# Patient Record
Sex: Female | Born: 1935 | Race: White | Hispanic: No | Smoking: Never smoker
Health system: Southern US, Community
[De-identification: ages and names within clinical notes are randomized; demographics above are authoritative.]

## PROBLEM LIST (undated history)

## (undated) DIAGNOSIS — J302 Other seasonal allergic rhinitis: Secondary | ICD-10-CM

## (undated) DIAGNOSIS — F0390 Unspecified dementia without behavioral disturbance: Secondary | ICD-10-CM

## (undated) DIAGNOSIS — B029 Zoster without complications: Secondary | ICD-10-CM

## (undated) DIAGNOSIS — E538 Deficiency of other specified B group vitamins: Secondary | ICD-10-CM

## (undated) DIAGNOSIS — M1990 Unspecified osteoarthritis, unspecified site: Secondary | ICD-10-CM

## (undated) DIAGNOSIS — I739 Peripheral vascular disease, unspecified: Secondary | ICD-10-CM

## (undated) DIAGNOSIS — G250 Essential tremor: Secondary | ICD-10-CM

## (undated) DIAGNOSIS — J449 Chronic obstructive pulmonary disease, unspecified: Secondary | ICD-10-CM

## (undated) DIAGNOSIS — I10 Essential (primary) hypertension: Secondary | ICD-10-CM

## (undated) DIAGNOSIS — Q613 Polycystic kidney, unspecified: Secondary | ICD-10-CM

## (undated) DIAGNOSIS — I639 Cerebral infarction, unspecified: Secondary | ICD-10-CM

## (undated) DIAGNOSIS — E559 Vitamin D deficiency, unspecified: Secondary | ICD-10-CM

## (undated) DIAGNOSIS — K219 Gastro-esophageal reflux disease without esophagitis: Secondary | ICD-10-CM

## (undated) DIAGNOSIS — E785 Hyperlipidemia, unspecified: Secondary | ICD-10-CM

## (undated) HISTORY — PX: COLON SURGERY: SHX602

## (undated) HISTORY — PX: HYSTEROTOMY: SHX1776

## (undated) HISTORY — PX: ORIF ANKLE FRACTURE: SUR919

## (undated) HISTORY — PX: HERNIA REPAIR: SHX51

## (undated) HISTORY — PX: CATARACT EXTRACTION: SUR2

---

## 2003-04-17 ENCOUNTER — Other Ambulatory Visit: Payer: Self-pay

## 2004-01-19 ENCOUNTER — Ambulatory Visit: Payer: Self-pay | Admitting: Pain Medicine

## 2004-02-01 ENCOUNTER — Ambulatory Visit: Payer: Self-pay | Admitting: Pain Medicine

## 2004-04-05 ENCOUNTER — Ambulatory Visit: Payer: Self-pay | Admitting: Pain Medicine

## 2004-09-13 ENCOUNTER — Ambulatory Visit: Payer: Self-pay | Admitting: Pain Medicine

## 2004-12-19 ENCOUNTER — Ambulatory Visit: Payer: Self-pay | Admitting: *Deleted

## 2005-01-29 ENCOUNTER — Ambulatory Visit: Payer: Self-pay | Admitting: *Deleted

## 2005-03-16 ENCOUNTER — Ambulatory Visit: Payer: Self-pay | Admitting: Internal Medicine

## 2005-04-13 ENCOUNTER — Ambulatory Visit: Payer: Self-pay | Admitting: Urology

## 2005-10-08 ENCOUNTER — Ambulatory Visit: Payer: Self-pay | Admitting: Internal Medicine

## 2006-10-08 ENCOUNTER — Ambulatory Visit: Payer: Self-pay | Admitting: Pain Medicine

## 2006-10-09 ENCOUNTER — Ambulatory Visit: Payer: Self-pay | Admitting: Pain Medicine

## 2006-11-21 ENCOUNTER — Ambulatory Visit: Payer: Self-pay | Admitting: Pain Medicine

## 2006-12-18 ENCOUNTER — Ambulatory Visit: Payer: Self-pay | Admitting: Pain Medicine

## 2007-01-23 ENCOUNTER — Ambulatory Visit: Payer: Self-pay | Admitting: Pain Medicine

## 2007-02-03 ENCOUNTER — Ambulatory Visit: Payer: Self-pay | Admitting: Pain Medicine

## 2007-03-22 ENCOUNTER — Inpatient Hospital Stay: Payer: Self-pay | Admitting: Internal Medicine

## 2007-05-12 ENCOUNTER — Ambulatory Visit: Payer: Self-pay | Admitting: Family Medicine

## 2007-06-29 ENCOUNTER — Emergency Department: Payer: Self-pay | Admitting: Internal Medicine

## 2007-10-01 ENCOUNTER — Emergency Department: Payer: Self-pay | Admitting: Emergency Medicine

## 2007-10-01 ENCOUNTER — Ambulatory Visit: Payer: Self-pay | Admitting: Family Medicine

## 2008-01-04 ENCOUNTER — Ambulatory Visit: Payer: Self-pay | Admitting: Family Medicine

## 2008-01-16 ENCOUNTER — Ambulatory Visit: Payer: Self-pay | Admitting: Family Medicine

## 2008-02-20 ENCOUNTER — Inpatient Hospital Stay: Payer: Self-pay | Admitting: Internal Medicine

## 2008-03-30 ENCOUNTER — Emergency Department: Payer: Self-pay | Admitting: Emergency Medicine

## 2008-04-04 ENCOUNTER — Inpatient Hospital Stay: Payer: Self-pay | Admitting: Internal Medicine

## 2008-07-17 ENCOUNTER — Emergency Department: Payer: Self-pay | Admitting: Emergency Medicine

## 2008-09-14 ENCOUNTER — Inpatient Hospital Stay: Payer: Self-pay | Admitting: Internal Medicine

## 2009-01-20 ENCOUNTER — Ambulatory Visit: Payer: Self-pay | Admitting: Orthopedic Surgery

## 2009-02-06 ENCOUNTER — Emergency Department: Payer: Self-pay | Admitting: Emergency Medicine

## 2009-03-23 ENCOUNTER — Ambulatory Visit: Payer: Self-pay | Admitting: Internal Medicine

## 2009-06-26 ENCOUNTER — Emergency Department: Payer: Self-pay | Admitting: Internal Medicine

## 2009-07-14 ENCOUNTER — Emergency Department: Payer: Self-pay | Admitting: Emergency Medicine

## 2009-07-22 ENCOUNTER — Inpatient Hospital Stay: Payer: Self-pay | Admitting: Internal Medicine

## 2009-10-11 ENCOUNTER — Ambulatory Visit: Payer: Self-pay | Admitting: Internal Medicine

## 2009-10-13 ENCOUNTER — Ambulatory Visit: Payer: Self-pay | Admitting: Internal Medicine

## 2011-03-21 ENCOUNTER — Inpatient Hospital Stay: Payer: Self-pay | Admitting: Internal Medicine

## 2011-05-14 ENCOUNTER — Emergency Department: Payer: Self-pay | Admitting: Emergency Medicine

## 2011-05-14 LAB — CBC
HCT: 39.1 % (ref 35.0–47.0)
MCH: 30.3 pg (ref 26.0–34.0)
MCV: 91 fL (ref 80–100)
Platelet: 193 10*3/uL (ref 150–440)
RBC: 4.32 10*6/uL (ref 3.80–5.20)
RDW: 14.2 % (ref 11.5–14.5)
WBC: 6.8 10*3/uL (ref 3.6–11.0)

## 2011-05-14 LAB — COMPREHENSIVE METABOLIC PANEL
Albumin: 4 g/dL (ref 3.4–5.0)
Anion Gap: 8 (ref 7–16)
BUN: 22 mg/dL — ABNORMAL HIGH (ref 7–18)
Bilirubin,Total: 0.6 mg/dL (ref 0.2–1.0)
Chloride: 106 mmol/L (ref 98–107)
Co2: 29 mmol/L (ref 21–32)
Creatinine: 1.06 mg/dL (ref 0.60–1.30)
EGFR (African American): >60
EGFR (Non-African Amer.): 52 — ABNORMAL LOW
Glucose: 96 mg/dL (ref 65–99)
Potassium: 4.5 mmol/L (ref 3.5–5.1)
SGOT(AST): 24 U/L (ref 15–37)
SGPT (ALT): 26 U/L
Total Protein: 7.9 g/dL (ref 6.4–8.2)

## 2011-05-14 LAB — URINALYSIS, COMPLETE
Bilirubin,UR: NEGATIVE
Glucose,UR: NEGATIVE mg/dL (ref 0–75)
Nitrite: POSITIVE
Protein: 30
RBC,UR: <1 /HPF (ref 0–5)
Specific Gravity: 1.017 (ref 1.003–1.030)
WBC UR: 10 /HPF (ref 0–5)

## 2011-05-14 LAB — TROPONIN I: Troponin-I: <0.02 ng/mL

## 2011-08-03 ENCOUNTER — Ambulatory Visit: Payer: Self-pay

## 2011-09-04 ENCOUNTER — Inpatient Hospital Stay: Payer: Self-pay | Admitting: Internal Medicine

## 2011-09-04 LAB — CBC WITH DIFFERENTIAL/PLATELET
Basophil #: 0 10*3/uL (ref 0.0–0.1)
Basophil %: 0.4 %
Eosinophil #: 0.3 10*3/uL (ref 0.0–0.7)
HCT: 41.5 % (ref 35.0–47.0)
Lymphocyte #: 1 10*3/uL (ref 1.0–3.6)
Lymphocyte %: 17.2 %
MCH: 29.5 pg (ref 26.0–34.0)
MCHC: 32.3 g/dL (ref 32.0–36.0)
MCV: 91 fL (ref 80–100)
Monocyte #: 0.7 x10 3/mm (ref 0.2–0.9)
Monocyte %: 11.8 %
Neutrophil #: 3.8 10*3/uL (ref 1.4–6.5)
Platelet: 176 10*3/uL (ref 150–440)
RBC: 4.54 10*6/uL (ref 3.80–5.20)
WBC: 5.8 10*3/uL (ref 3.6–11.0)

## 2011-09-04 LAB — URINALYSIS, COMPLETE
Bilirubin,UR: NEGATIVE
Blood: NEGATIVE
Glucose,UR: NEGATIVE mg/dL (ref 0–75)
Hyaline Cast: 30
Ketone: NEGATIVE
Ph: 5 (ref 4.5–8.0)
Protein: NEGATIVE
Specific Gravity: 1.021 (ref 1.003–1.030)
Squamous Epithelial: 5
WBC UR: 1 /HPF (ref 0–5)

## 2011-09-04 LAB — COMPREHENSIVE METABOLIC PANEL
BUN: 40 mg/dL — ABNORMAL HIGH (ref 7–18)
Bilirubin,Total: 0.5 mg/dL (ref 0.2–1.0)
Calcium, Total: 8.6 mg/dL (ref 8.5–10.1)
Co2: 24 mmol/L (ref 21–32)
EGFR (Non-African Amer.): 32 — ABNORMAL LOW
Glucose: 92 mg/dL (ref 65–99)
Osmolality: 293 (ref 275–301)
Potassium: 4.7 mmol/L (ref 3.5–5.1)
SGPT (ALT): 21 U/L
Sodium: 142 mmol/L (ref 136–145)
Total Protein: 6.6 g/dL (ref 6.4–8.2)

## 2011-09-04 LAB — MAGNESIUM: Magnesium: 2.4 mg/dL

## 2011-09-05 LAB — BASIC METABOLIC PANEL
Anion Gap: 8 (ref 7–16)
Calcium, Total: 8.4 mg/dL — ABNORMAL LOW (ref 8.5–10.1)
Chloride: 108 mmol/L — ABNORMAL HIGH (ref 98–107)
Co2: 26 mmol/L (ref 21–32)
Creatinine: 1.2 mg/dL (ref 0.60–1.30)
Glucose: 85 mg/dL (ref 65–99)
Osmolality: 289 (ref 275–301)
Sodium: 142 mmol/L (ref 136–145)

## 2011-09-05 LAB — CBC WITH DIFFERENTIAL/PLATELET
Basophil %: 0.4 %
Eosinophil %: 4.8 %
HGB: 13.4 g/dL (ref 12.0–16.0)
Lymphocyte %: 15.5 %
MCHC: 33.5 g/dL (ref 32.0–36.0)
MCV: 90 fL (ref 80–100)
Monocyte %: 11.3 %
Neutrophil #: 3.9 10*3/uL (ref 1.4–6.5)
Neutrophil %: 68 %
Platelet: 151 10*3/uL (ref 150–440)
RBC: 4.44 10*6/uL (ref 3.80–5.20)
WBC: 5.8 10*3/uL (ref 3.6–11.0)

## 2011-09-07 LAB — URINE CULTURE

## 2011-09-08 LAB — BASIC METABOLIC PANEL
BUN: 15 mg/dL (ref 7–18)
Co2: 25 mmol/L (ref 21–32)
Creatinine: 0.8 mg/dL (ref 0.60–1.30)
EGFR (African American): >60
Osmolality: 279 (ref 275–301)

## 2011-09-09 LAB — CBC WITH DIFFERENTIAL/PLATELET
Basophil #: 0 10*3/uL (ref 0.0–0.1)
Basophil %: 0.2 %
Eosinophil #: 0.1 10*3/uL (ref 0.0–0.7)
Eosinophil %: 0.9 %
HGB: 14.8 g/dL (ref 12.0–16.0)
Lymphocyte #: 1.1 10*3/uL (ref 1.0–3.6)
MCV: 88 fL (ref 80–100)
Monocyte %: 8.8 %
Neutrophil #: 5.7 10*3/uL (ref 1.4–6.5)
Neutrophil %: 75.3 %
Platelet: 231 10*3/uL (ref 150–440)
RBC: 5.01 10*6/uL (ref 3.80–5.20)
RDW: 13.8 % (ref 11.5–14.5)
WBC: 7.6 10*3/uL (ref 3.6–11.0)

## 2011-09-09 LAB — COMPREHENSIVE METABOLIC PANEL
Alkaline Phosphatase: 86 U/L (ref 50–136)
Bilirubin,Total: 0.5 mg/dL (ref 0.2–1.0)
Co2: 25 mmol/L (ref 21–32)
Creatinine: 0.89 mg/dL (ref 0.60–1.30)
EGFR (Non-African Amer.): 57 — ABNORMAL LOW
Osmolality: 281 (ref 275–301)
Potassium: 3.7 mmol/L (ref 3.5–5.1)
SGPT (ALT): 38 U/L
Sodium: 139 mmol/L (ref 136–145)
Total Protein: 7 g/dL (ref 6.4–8.2)

## 2011-09-10 LAB — CBC WITH DIFFERENTIAL/PLATELET
Basophil %: 0.2 %
Eosinophil %: 0.6 %
HCT: 40.9 % (ref 35.0–47.0)
HGB: 14 g/dL (ref 12.0–16.0)
Lymphocyte #: 1.1 10*3/uL (ref 1.0–3.6)
Lymphocyte %: 14 %
MCV: 88 fL (ref 80–100)
Monocyte #: 0.7 x10 3/mm (ref 0.2–0.9)
Monocyte %: 8.8 %
Neutrophil #: 6.3 10*3/uL (ref 1.4–6.5)
RBC: 4.65 10*6/uL (ref 3.80–5.20)
RDW: 13.7 % (ref 11.5–14.5)

## 2011-09-10 LAB — COMPREHENSIVE METABOLIC PANEL
Anion Gap: 8 (ref 7–16)
BUN: 17 mg/dL (ref 7–18)
Bilirubin,Total: 0.5 mg/dL (ref 0.2–1.0)
Calcium, Total: 8.5 mg/dL (ref 8.5–10.1)
Chloride: 107 mmol/L (ref 98–107)
Creatinine: 0.9 mg/dL (ref 0.60–1.30)
EGFR (African American): >60
EGFR (Non-African Amer.): 57 — ABNORMAL LOW
Osmolality: 282 (ref 275–301)
SGOT(AST): 38 U/L — ABNORMAL HIGH (ref 15–37)
Sodium: 141 mmol/L (ref 136–145)

## 2011-09-10 LAB — CULTURE, BLOOD (SINGLE)

## 2011-09-10 LAB — TSH: Thyroid Stimulating Horm: 1.8 u[IU]/mL

## 2011-09-12 LAB — BASIC METABOLIC PANEL WITH GFR
Anion Gap: 8
BUN: 20 mg/dL — ABNORMAL HIGH
Calcium, Total: 8.3 mg/dL — ABNORMAL LOW
Chloride: 107 mmol/L
Co2: 26 mmol/L
Creatinine: 1.06 mg/dL
EGFR (African American): 54 — ABNORMAL LOW
EGFR (Non-African Amer.): 47 — ABNORMAL LOW
Glucose: 92 mg/dL
Osmolality: 284
Potassium: 3.5 mmol/L
Sodium: 141 mmol/L

## 2011-09-13 LAB — BASIC METABOLIC PANEL
Anion Gap: 4 — ABNORMAL LOW (ref 7–16)
Calcium, Total: 8.3 mg/dL — ABNORMAL LOW (ref 8.5–10.1)
Co2: 29 mmol/L (ref 21–32)
Creatinine: 1.06 mg/dL (ref 0.60–1.30)
EGFR (African American): 54 — ABNORMAL LOW
EGFR (Non-African Amer.): 47 — ABNORMAL LOW
Glucose: 93 mg/dL (ref 65–99)

## 2011-09-14 LAB — CBC WITH DIFFERENTIAL/PLATELET
Basophil #: 0 10*3/uL (ref 0.0–0.1)
Basophil %: 0.5 %
HCT: 38.7 % (ref 35.0–47.0)
HGB: 13.1 g/dL (ref 12.0–16.0)
Lymphocyte #: 1.1 10*3/uL (ref 1.0–3.6)
Lymphocyte %: 20.9 %
MCH: 30 pg (ref 26.0–34.0)
Monocyte #: 0.5 x10 3/mm (ref 0.2–0.9)
Neutrophil %: 63.5 %
Platelet: 155 10*3/uL (ref 150–440)
RBC: 4.36 10*6/uL (ref 3.80–5.20)
RDW: 14.1 % (ref 11.5–14.5)

## 2011-09-14 LAB — COMPREHENSIVE METABOLIC PANEL
Alkaline Phosphatase: 73 U/L (ref 50–136)
BUN: 14 mg/dL (ref 7–18)
Bilirubin,Total: 0.4 mg/dL (ref 0.2–1.0)
Chloride: 105 mmol/L (ref 98–107)
Glucose: 96 mg/dL (ref 65–99)
Osmolality: 283 (ref 275–301)
Potassium: 3.7 mmol/L (ref 3.5–5.1)
SGPT (ALT): 34 U/L
Sodium: 142 mmol/L (ref 136–145)

## 2011-12-20 ENCOUNTER — Ambulatory Visit: Payer: Self-pay | Admitting: Internal Medicine

## 2012-04-29 ENCOUNTER — Emergency Department: Payer: Self-pay | Admitting: Emergency Medicine

## 2012-04-30 LAB — BASIC METABOLIC PANEL
Anion Gap: 8 (ref 7–16)
Calcium, Total: 8.8 mg/dL (ref 8.5–10.1)
Chloride: 105 mmol/L (ref 98–107)
Co2: 24 mmol/L (ref 21–32)
Creatinine: 1.14 mg/dL (ref 0.60–1.30)
EGFR (African American): 49 — ABNORMAL LOW
EGFR (Non-African Amer.): 43 — ABNORMAL LOW
Osmolality: 278 (ref 275–301)
Sodium: 137 mmol/L (ref 136–145)

## 2012-04-30 LAB — CBC
HGB: 14.4 g/dL (ref 12.0–16.0)
MCH: 29.3 pg (ref 26.0–34.0)
MCHC: 32.7 g/dL (ref 32.0–36.0)
Platelet: 248 10*3/uL (ref 150–440)
WBC: 13.7 10*3/uL — ABNORMAL HIGH (ref 3.6–11.0)

## 2012-04-30 LAB — URINALYSIS, COMPLETE
Bacteria: NONE SEEN
Glucose,UR: NEGATIVE mg/dL (ref 0–75)
Protein: 100
RBC,UR: 4 /HPF (ref 0–5)
Specific Gravity: 1.014 (ref 1.003–1.030)

## 2012-05-20 ENCOUNTER — Emergency Department: Payer: Self-pay | Admitting: Emergency Medicine

## 2012-06-23 ENCOUNTER — Emergency Department: Payer: Self-pay | Admitting: Emergency Medicine

## 2012-06-23 LAB — BASIC METABOLIC PANEL
Calcium, Total: 8.4 mg/dL — ABNORMAL LOW (ref 8.5–10.1)
Chloride: 105 mmol/L (ref 98–107)
Co2: 30 mmol/L (ref 21–32)
Creatinine: 1.03 mg/dL (ref 0.60–1.30)
EGFR (African American): 56 — ABNORMAL LOW
Glucose: 96 mg/dL (ref 65–99)
Osmolality: 283 (ref 275–301)
Sodium: 141 mmol/L (ref 136–145)

## 2012-06-23 LAB — CBC
HCT: 39.6 % (ref 35.0–47.0)
HGB: 13.4 g/dL (ref 12.0–16.0)
MCH: 30.9 pg (ref 26.0–34.0)
MCHC: 33.9 g/dL (ref 32.0–36.0)
MCV: 91 fL (ref 80–100)
Platelet: 182 10*3/uL (ref 150–440)
RBC: 4.35 10*6/uL (ref 3.80–5.20)
RDW: 14.5 % (ref 11.5–14.5)

## 2012-06-23 LAB — URINALYSIS, COMPLETE
Bilirubin,UR: NEGATIVE
Glucose,UR: NEGATIVE mg/dL (ref 0–75)
Ph: 6 (ref 4.5–8.0)
Protein: NEGATIVE
RBC,UR: 1 /HPF (ref 0–5)
Specific Gravity: 1.014 (ref 1.003–1.030)
WBC UR: 3 /HPF (ref 0–5)

## 2012-09-07 ENCOUNTER — Emergency Department: Payer: Self-pay | Admitting: Emergency Medicine

## 2012-11-05 ENCOUNTER — Emergency Department: Payer: Self-pay | Admitting: Emergency Medicine

## 2012-11-21 ENCOUNTER — Ambulatory Visit: Payer: Self-pay | Admitting: Unknown Physician Specialty

## 2012-12-11 ENCOUNTER — Ambulatory Visit: Payer: Self-pay | Admitting: Physical Medicine and Rehabilitation

## 2013-03-24 ENCOUNTER — Inpatient Hospital Stay: Payer: Self-pay

## 2013-03-24 LAB — CBC
MCHC: 33.9 g/dL (ref 32.0–36.0)
MCV: 91 fL (ref 80–100)
Platelet: 138 10*3/uL — ABNORMAL LOW (ref 150–440)
RBC: 4.16 10*6/uL (ref 3.80–5.20)

## 2013-03-24 LAB — COMPREHENSIVE METABOLIC PANEL
Anion Gap: 6 — ABNORMAL LOW (ref 7–16)
BUN: 17 mg/dL (ref 7–18)
Calcium, Total: 8.9 mg/dL (ref 8.5–10.1)
Co2: 24 mmol/L (ref 21–32)
Creatinine: 1.1 mg/dL (ref 0.60–1.30)
EGFR (African American): 51 — ABNORMAL LOW
EGFR (Non-African Amer.): 44 — ABNORMAL LOW
SGOT(AST): 34 U/L (ref 15–37)
Sodium: 136 mmol/L (ref 136–145)
Total Protein: 7 g/dL (ref 6.4–8.2)

## 2013-03-24 LAB — URINALYSIS, COMPLETE
Bilirubin,UR: NEGATIVE
Blood: NEGATIVE
Glucose,UR: NEGATIVE mg/dL (ref 0–75)
Ketone: NEGATIVE
Leukocyte Esterase: NEGATIVE
Ph: 7 (ref 4.5–8.0)
RBC,UR: 2 /HPF (ref 0–5)
WBC UR: 1 /HPF (ref 0–5)

## 2013-03-24 LAB — TROPONIN I: Troponin-I: <0.02 ng/mL

## 2013-03-24 LAB — RAPID INFLUENZA A&B ANTIGENS

## 2013-03-25 LAB — CBC WITH DIFFERENTIAL/PLATELET
Eosinophil %: 0.1 %
HCT: 36.5 % (ref 35.0–47.0)
Lymphocyte #: 0.4 10*3/uL — ABNORMAL LOW (ref 1.0–3.6)
Lymphocyte %: 8.8 %
MCH: 30.8 pg (ref 26.0–34.0)
MCV: 91 fL (ref 80–100)
Neutrophil %: 73.2 %
Platelet: 130 10*3/uL — ABNORMAL LOW (ref 150–440)
RBC: 4.03 10*6/uL (ref 3.80–5.20)
WBC: 4.1 10*3/uL (ref 3.6–11.0)

## 2013-03-25 LAB — BASIC METABOLIC PANEL
BUN: 17 mg/dL (ref 7–18)
Co2: 28 mmol/L (ref 21–32)
Creatinine: 0.94 mg/dL (ref 0.60–1.30)
EGFR (Non-African Amer.): 53 — ABNORMAL LOW
Glucose: 105 mg/dL — ABNORMAL HIGH (ref 65–99)
Potassium: 3.7 mmol/L (ref 3.5–5.1)
Sodium: 140 mmol/L (ref 136–145)

## 2013-03-26 ENCOUNTER — Ambulatory Visit: Payer: Self-pay | Admitting: Internal Medicine

## 2013-03-26 LAB — CBC WITH DIFFERENTIAL/PLATELET
Basophil #: 0 10*3/uL (ref 0.0–0.1)
Basophil %: 0.4 %
Eosinophil #: 0 10*3/uL (ref 0.0–0.7)
Eosinophil %: 0.5 %
HCT: 34 % — ABNORMAL LOW (ref 35.0–47.0)
HGB: 11.6 g/dL — ABNORMAL LOW (ref 12.0–16.0)
Lymphocyte #: 0.4 10*3/uL — ABNORMAL LOW (ref 1.0–3.6)
Lymphocyte %: 12.6 %
MCH: 30.6 pg (ref 26.0–34.0)
MCHC: 34 g/dL (ref 32.0–36.0)
MCV: 90 fL (ref 80–100)
Monocyte #: 0.7 x10 3/mm (ref 0.2–0.9)
Monocyte %: 19 %
Neutrophil #: 2.3 10*3/uL (ref 1.4–6.5)
Neutrophil %: 67.5 %
Platelet: 137 10*3/uL — ABNORMAL LOW (ref 150–440)
RBC: 3.78 10*6/uL — ABNORMAL LOW (ref 3.80–5.20)
RDW: 13.6 % (ref 11.5–14.5)
WBC: 3.5 10*3/uL — ABNORMAL LOW (ref 3.6–11.0)

## 2013-03-26 LAB — BASIC METABOLIC PANEL
Anion Gap: 4 — ABNORMAL LOW (ref 7–16)
BUN: 18 mg/dL (ref 7–18)
Calcium, Total: 8.2 mg/dL — ABNORMAL LOW (ref 8.5–10.1)
Chloride: 105 mmol/L (ref 98–107)
Co2: 31 mmol/L (ref 21–32)
Creatinine: 1.11 mg/dL (ref 0.60–1.30)
EGFR (African American): 51 — ABNORMAL LOW
EGFR (Non-African Amer.): 44 — ABNORMAL LOW
Glucose: 93 mg/dL (ref 65–99)
Osmolality: 281 (ref 275–301)
Potassium: 3.3 mmol/L — ABNORMAL LOW (ref 3.5–5.1)
Sodium: 140 mmol/L (ref 136–145)

## 2013-03-26 LAB — URINE CULTURE

## 2013-03-27 LAB — BASIC METABOLIC PANEL
Anion Gap: 5 — ABNORMAL LOW (ref 7–16)
BUN: 17 mg/dL (ref 7–18)
Calcium, Total: 8.7 mg/dL (ref 8.5–10.1)
Chloride: 103 mmol/L (ref 98–107)
Co2: 29 mmol/L (ref 21–32)
Creatinine: 1.18 mg/dL (ref 0.60–1.30)
EGFR (Non-African Amer.): 41 — ABNORMAL LOW
GFR CALC AF AMER: 47 — AB
Glucose: 90 mg/dL (ref 65–99)
Osmolality: 275 (ref 275–301)
Potassium: 3 mmol/L — ABNORMAL LOW (ref 3.5–5.1)
SODIUM: 137 mmol/L (ref 136–145)

## 2013-03-27 LAB — MAGNESIUM: MAGNESIUM: 1.9 mg/dL

## 2013-03-27 LAB — CBC WITH DIFFERENTIAL/PLATELET
BASOS ABS: 0 10*3/uL (ref 0.0–0.1)
Basophil %: 0.8 %
Eosinophil #: 0 10*3/uL (ref 0.0–0.7)
Eosinophil %: 1.2 %
HCT: 37.1 % (ref 35.0–47.0)
HGB: 12.7 g/dL (ref 12.0–16.0)
Lymphocyte #: 0.5 10*3/uL — ABNORMAL LOW (ref 1.0–3.6)
Lymphocyte %: 16.1 %
MCH: 30.7 pg (ref 26.0–34.0)
MCHC: 34.3 g/dL (ref 32.0–36.0)
MCV: 90 fL (ref 80–100)
Monocyte #: 0.6 x10 3/mm (ref 0.2–0.9)
Monocyte %: 18.7 %
NEUTROS ABS: 2 10*3/uL (ref 1.4–6.5)
NEUTROS PCT: 63.2 %
Platelet: 157 10*3/uL (ref 150–440)
RBC: 4.14 10*6/uL (ref 3.80–5.20)
RDW: 13.6 % (ref 11.5–14.5)
WBC: 3.1 10*3/uL — AB (ref 3.6–11.0)

## 2013-03-28 LAB — BASIC METABOLIC PANEL
Anion Gap: 8 (ref 7–16)
BUN: 14 mg/dL (ref 7–18)
CREATININE: 0.97 mg/dL (ref 0.60–1.30)
Calcium, Total: 8.4 mg/dL — ABNORMAL LOW (ref 8.5–10.1)
Chloride: 108 mmol/L — ABNORMAL HIGH (ref 98–107)
Co2: 22 mmol/L (ref 21–32)
EGFR (African American): 60 — ABNORMAL LOW
GFR CALC NON AF AMER: 51 — AB
GLUCOSE: 84 mg/dL (ref 65–99)
Osmolality: 275 (ref 275–301)
POTASSIUM: 3.5 mmol/L (ref 3.5–5.1)
Sodium: 138 mmol/L (ref 136–145)

## 2013-03-28 LAB — CULTURE, BLOOD (SINGLE)

## 2013-03-28 LAB — WBC: WBC: 3.1 10*3/uL — ABNORMAL LOW (ref 3.6–11.0)

## 2013-03-29 LAB — CBC WITH DIFFERENTIAL/PLATELET
Basophil #: 0 10*3/uL (ref 0.0–0.1)
Basophil %: 0.3 %
EOS ABS: 0.1 10*3/uL (ref 0.0–0.7)
EOS PCT: 4.5 %
HCT: 37.9 % (ref 35.0–47.0)
HGB: 13 g/dL (ref 12.0–16.0)
LYMPHS PCT: 17.8 %
Lymphocyte #: 0.6 10*3/uL — ABNORMAL LOW (ref 1.0–3.6)
MCH: 30.7 pg (ref 26.0–34.0)
MCHC: 34.2 g/dL (ref 32.0–36.0)
MCV: 90 fL (ref 80–100)
MONOS PCT: 16.2 %
Monocyte #: 0.5 x10 3/mm (ref 0.2–0.9)
Neutrophil #: 2 10*3/uL (ref 1.4–6.5)
Neutrophil %: 61.2 %
Platelet: 161 10*3/uL (ref 150–440)
RBC: 4.24 10*6/uL (ref 3.80–5.20)
RDW: 13.8 % (ref 11.5–14.5)
WBC: 3.3 10*3/uL — ABNORMAL LOW (ref 3.6–11.0)

## 2013-03-29 LAB — BASIC METABOLIC PANEL
ANION GAP: 7 (ref 7–16)
BUN: 14 mg/dL (ref 7–18)
CHLORIDE: 110 mmol/L — AB (ref 98–107)
CO2: 25 mmol/L (ref 21–32)
CREATININE: 1.18 mg/dL (ref 0.60–1.30)
Calcium, Total: 8.6 mg/dL (ref 8.5–10.1)
EGFR (African American): 47 — ABNORMAL LOW
GFR CALC NON AF AMER: 41 — AB
GLUCOSE: 105 mg/dL — AB (ref 65–99)
Osmolality: 284 (ref 275–301)
Potassium: 3.4 mmol/L — ABNORMAL LOW (ref 3.5–5.1)
Sodium: 142 mmol/L (ref 136–145)

## 2013-03-30 LAB — CBC WITH DIFFERENTIAL/PLATELET
Basophil #: 0 10*3/uL (ref 0.0–0.1)
Basophil %: 0.6 %
EOS PCT: 6.3 %
Eosinophil #: 0.3 10*3/uL (ref 0.0–0.7)
HCT: 37.4 % (ref 35.0–47.0)
HGB: 12.8 g/dL (ref 12.0–16.0)
Lymphocyte #: 0.7 10*3/uL — ABNORMAL LOW (ref 1.0–3.6)
Lymphocyte %: 15.1 %
MCH: 30.7 pg (ref 26.0–34.0)
MCHC: 34.3 g/dL (ref 32.0–36.0)
MCV: 90 fL (ref 80–100)
Monocyte #: 0.6 x10 3/mm (ref 0.2–0.9)
Monocyte %: 13 %
Neutrophil #: 3.1 10*3/uL (ref 1.4–6.5)
Neutrophil %: 65 %
Platelet: 168 10*3/uL (ref 150–440)
RBC: 4.17 10*6/uL (ref 3.80–5.20)
RDW: 13.9 % (ref 11.5–14.5)
WBC: 4.8 10*3/uL (ref 3.6–11.0)

## 2013-03-30 LAB — BASIC METABOLIC PANEL
Anion Gap: 4 — ABNORMAL LOW (ref 7–16)
BUN: 17 mg/dL (ref 7–18)
CHLORIDE: 112 mmol/L — AB (ref 98–107)
CREATININE: 1.06 mg/dL (ref 0.60–1.30)
Calcium, Total: 8.8 mg/dL (ref 8.5–10.1)
Co2: 27 mmol/L (ref 21–32)
EGFR (African American): 54 — ABNORMAL LOW
EGFR (Non-African Amer.): 46 — ABNORMAL LOW
GLUCOSE: 96 mg/dL (ref 65–99)
Osmolality: 286 (ref 275–301)
Potassium: 3.8 mmol/L (ref 3.5–5.1)
Sodium: 143 mmol/L (ref 136–145)

## 2013-03-31 LAB — HEPATIC FUNCTION PANEL A (ARMC)
ALBUMIN: 3.3 g/dL — AB (ref 3.4–5.0)
ALT: 28 U/L (ref 12–78)
Alkaline Phosphatase: 66 U/L
Bilirubin, Direct: 0.1 mg/dL (ref 0.00–0.20)
Bilirubin,Total: 0.4 mg/dL (ref 0.2–1.0)
SGOT(AST): 25 U/L (ref 15–37)
TOTAL PROTEIN: 7.1 g/dL (ref 6.4–8.2)

## 2013-03-31 LAB — POTASSIUM: Potassium: 3.7 mmol/L (ref 3.5–5.1)

## 2013-03-31 LAB — LIPASE, BLOOD: Lipase: 309 U/L (ref 73–393)

## 2013-03-31 LAB — AMYLASE: AMYLASE: 67 U/L (ref 25–115)

## 2013-04-01 LAB — BASIC METABOLIC PANEL
Anion Gap: 5 — ABNORMAL LOW (ref 7–16)
BUN: 27 mg/dL — AB (ref 7–18)
Calcium, Total: 9.3 mg/dL (ref 8.5–10.1)
Chloride: 112 mmol/L — ABNORMAL HIGH (ref 98–107)
Co2: 24 mmol/L (ref 21–32)
Creatinine: 0.91 mg/dL (ref 0.60–1.30)
EGFR (African American): >60
EGFR (Non-African Amer.): 56 — ABNORMAL LOW
GLUCOSE: 114 mg/dL — AB (ref 65–99)
OSMOLALITY: 287 (ref 275–301)
Potassium: 4.1 mmol/L (ref 3.5–5.1)
SODIUM: 141 mmol/L (ref 136–145)

## 2013-04-01 LAB — CLOSTRIDIUM DIFFICILE(ARMC)

## 2013-04-02 LAB — CBC WITH DIFFERENTIAL/PLATELET
BASOS ABS: 0 10*3/uL (ref 0.0–0.1)
BASOS PCT: 0.2 %
Eosinophil #: 0 10*3/uL (ref 0.0–0.7)
Eosinophil %: 0.6 %
HCT: 38.9 % (ref 35.0–47.0)
HGB: 12.9 g/dL (ref 12.0–16.0)
LYMPHS ABS: 0.9 10*3/uL — AB (ref 1.0–3.6)
Lymphocyte %: 11.6 %
MCH: 29.9 pg (ref 26.0–34.0)
MCHC: 33.2 g/dL (ref 32.0–36.0)
MCV: 90 fL (ref 80–100)
Monocyte #: 0.7 x10 3/mm (ref 0.2–0.9)
Monocyte %: 8.8 %
NEUTROS ABS: 5.8 10*3/uL (ref 1.4–6.5)
NEUTROS PCT: 78.8 %
Platelet: 232 10*3/uL (ref 150–440)
RBC: 4.32 10*6/uL (ref 3.80–5.20)
RDW: 14.1 % (ref 11.5–14.5)
WBC: 7.4 10*3/uL (ref 3.6–11.0)

## 2013-04-02 LAB — BASIC METABOLIC PANEL
Anion Gap: 6 — ABNORMAL LOW (ref 7–16)
BUN: 29 mg/dL — ABNORMAL HIGH (ref 7–18)
CHLORIDE: 113 mmol/L — AB (ref 98–107)
CREATININE: 1.07 mg/dL (ref 0.60–1.30)
Calcium, Total: 8.4 mg/dL — ABNORMAL LOW (ref 8.5–10.1)
Co2: 26 mmol/L (ref 21–32)
EGFR (African American): 53 — ABNORMAL LOW
EGFR (Non-African Amer.): 46 — ABNORMAL LOW
Glucose: 102 mg/dL — ABNORMAL HIGH (ref 65–99)
Osmolality: 295 (ref 275–301)
Potassium: 3.6 mmol/L (ref 3.5–5.1)
Sodium: 145 mmol/L (ref 136–145)

## 2013-04-03 LAB — BASIC METABOLIC PANEL
Anion Gap: 6 — ABNORMAL LOW (ref 7–16)
BUN: 20 mg/dL — ABNORMAL HIGH (ref 7–18)
CALCIUM: 8.5 mg/dL (ref 8.5–10.1)
CREATININE: 0.87 mg/dL (ref 0.60–1.30)
Chloride: 107 mmol/L (ref 98–107)
Co2: 26 mmol/L (ref 21–32)
EGFR (Non-African Amer.): 59 — ABNORMAL LOW
GLUCOSE: 89 mg/dL (ref 65–99)
Osmolality: 280 (ref 275–301)
Potassium: 3.7 mmol/L (ref 3.5–5.1)
SODIUM: 139 mmol/L (ref 136–145)

## 2013-04-26 ENCOUNTER — Ambulatory Visit: Payer: Self-pay | Admitting: Internal Medicine

## 2013-08-26 ENCOUNTER — Emergency Department: Payer: Self-pay | Admitting: Emergency Medicine

## 2013-09-17 ENCOUNTER — Emergency Department: Payer: Self-pay | Admitting: Emergency Medicine

## 2013-09-17 LAB — URINALYSIS, COMPLETE
BILIRUBIN, UR: NEGATIVE
BLOOD: NEGATIVE
Glucose,UR: NEGATIVE mg/dL (ref 0–75)
KETONE: NEGATIVE
LEUKOCYTE ESTERASE: NEGATIVE
NITRITE: POSITIVE
Ph: 7 (ref 4.5–8.0)
RBC,UR: 1 /HPF (ref 0–5)
SPECIFIC GRAVITY: 1.017 (ref 1.003–1.030)
Squamous Epithelial: NONE SEEN
WBC UR: 5 /HPF (ref 0–5)

## 2013-09-19 LAB — URINE CULTURE

## 2014-07-17 NOTE — H&P (Signed)
PATIENT NAME:  Cynthia Glenn, Latia B MR#:  [id number] DATE OF BIRTH:  1920/08/27  DATE OF ADMISSION:  03/24/2013  PRIMARY CARE PHYSICIAN:  Dr. Daniel NonesBert Klein.   REFERRING PHYSICIAN:  Dr. Sharyn CreamerMark Quale.   CHIEF COMPLAINT:  Generalized weakness, confusion.   HISTORY OF PRESENT ILLNESS:  Ms. Cynthia Glenn is a 81 year old female with a history of hypertension, degenerative joint disease, COPD, previous history of CVA, is brought to the Emergency Department by family with the concern about confusion since this morning.  Per family, the patient was doing well even yesterday, walking around in the nursing home.  Today, the patient has been lying down in the bed lethargic.  Did not tolerate any diet.  The patient has been having mild cough.  Could not tell if there was any productive sputum.  Work-up in the Emergency Department, no obvious signs of any infection except for chest x-ray showing bilateral lower lobe atelectasis versus pneumonia.  The patient does not have any elevated white blood cell count.  Influenza is negative.  Could not obtain any history from the patient.  At baseline per daughter the patient communicates well, walks around in the nursing home.  The patient's daughter states this is significantly changed from her baseline.   PAST MEDICAL HISTORY: 1.  Restless leg syndrome.  2.  Degenerative joint disease.  3.  Gastroesophageal reflux disease.  4.  Allergic rhinitis.  5.  COPD.  6.  CVA.  7.  Alzheimer's dementia.  8.  Chronic myofascial pain syndrome.  9.  Anxiety.  10.  Depression.  11.  Hypertension.  12.  Hyperlipidemia.  13.  Polycystic kidney disease.   PAST SURGICAL HISTORY: 1.  Left lower extremity fracture, open reduction and internal fixation.  2.  Total hysterectomy.  3.  Cataract removal.  4.  Appendectomy.  5.  Bowel resection.   ALLERGIES:  LISINOPRIL CAUSED COUGH, STATIN.   HOME MEDICATIONS: 1.  Vitamin D3 2000 units once a day.  2.  Vitamin B12 1 tablet once a day.   3.  Triamcinolone to the affected area.  4.  Seroquel 150 mg extended release daily.  5.  Pravastatin 20 mg daily.  6.  Calcium 1 tablet once a day.  7.  Nitrofurantoin 1 tablet once a day.  8.  Lorazepam 0.5 mg 2 times a day.  9.  Gabapentin 300 mg 2 times a day.  10.  Fluticasone 50 mg once a day.  11.  Duloxetine 30 mg once a day.  12.  Donepezil 10 mg once a day.  13.  Docusate sodium 1 capsule 2 times a day.  14.  Diovan 320 mg once a day.  15.  Dexilant 60 mg once a day.  16.  Cranberry 1 capsule once a day.  17.  Atenolol 100 mg once a day.  18.  Aspirin 81 mg once a day.  19.  Amlodipine 10 mg once a day.  20.  Norco 1 tablet 3 times a day as needed.   SOCIAL HISTORY:  No history of smoking, drinking alcohol or using illicit drugs.  Lives at [facility].   FAMILY HISTORY:  Asthma, stroke, breast cancer, coronary artery disease.   REVIEW OF SYSTEMS:  Could not be obtained from the patient as the patient has altered mental status.   PHYSICAL EXAMINATION: GENERAL:  This is a well-built, well-nourished, age-appropriate female lying down in the bed moaning, however denies any pain.  VITAL SIGNS:  Temperature 98.7, pulse 67, blood pressure 134/62,  respiratory rate of 18, oxygen saturation is 96% on 2 liters of oxygen.  HEENT:  Head normocephalic, atraumatic.  Eyes, no scleral icterus.  Conjunctivae normal.  Pupils equal and react to light.  Extraocular movements are intact.  Mucous membranes dry.  Could not examine the oropharynx.  NECK:  Supple.  No lymphadenopathy.  No JVD.  No carotid bruit.  No thyromegaly.  CHEST:  Has no focal tenderness.  Decreased breath sounds in bilateral lower lobes.   HEART:  S1, S2, regular.  No murmurs are heard.  ABDOMEN:  Bowel sounds plus.  Soft, nontender, nondistended.  Could not appreciate any hepatosplenomegaly.  EXTREMITIES:  No pedal edema.  Pulses 2+.  SKIN:  No rash or lesions.  MUSCULOSKELETAL:  Could not examine as patient is not  cooperative.  NEUROLOGIC:  The patient is oriented to self, but not oriented to place, person and time.  No apparent cranial nerve abnormalities.  Could not examine the motor or sensory.   LABORATORY DATA:  ABG:  PH of 7.51, pCO2 of 33, PaO2 of 133.  BMP is completely within normal limits.  CT head without contrast:  No acute intracranial abnormalities.  CBC:  WBC of 5.6, hemoglobin 12.8.  UA negative for nitrites and leukocyte esterase.   ASSESSMENT AND PLAN:  Cynthia Glenn is a 81 year old female is brought to the Emergency Department with altered mental status.  1.  Altered mental status.  This could be a combination of medications and possible questionable pneumonia.  We will hold all sedative medications.  Continue with IV fluids and keep the patient on levofloxacin.  2.  Debility.  We will involve the physical therapy, occupational therapy.  3.  Dehydration.  Continue with IV fluids.  4.  Keep the patient on deep vein thrombosis prophylaxis with Lovenox.   TIME SPENT:  45 minutes.    ____________________________ Susa Griffins, MD pv:ea D: 03/25/2013 00:02:15 ET T: 03/25/2013 01:08:33 ET JOB#: [id number]  cc: Susa Griffins, MD, <Dictator> Lynnea Ferrier, MD Susa Griffins MD ELECTRONICALLY SIGNED 04/03/2013 21:22

## 2014-07-17 NOTE — Consult Note (Signed)
PATIENT NAME:  Cynthia Glenn, Cynthia Glenn MR#:  [id number] DATE OF BIRTH:  1921/02/06  DATE OF CONSULTATION:  03/31/2013  REFERRING PHYSICIAN:  Lynnea Ferrier, MD CONSULTING PHYSICIAN:  Ranae Plumber. Arvilla Market, ANP (Adult Nurse Practitioner) and Scot Jun, MD  REASON FOR CONSULTATION: Postprandial abdominal pain.   HISTORY OF PRESENT ILLNESS: This 81 year old patient was admitted to the hospital 03/24/2013 with acute change in mental status. Chest x-ray showed bilateral lower lobe atelectasis versus pneumonia. Her head CT was negative. The patient was hospitalized and treated for metabolic encephalopathy, most likely due to pneumonia. She continues to have a cough. Yesterday, she passed loose stools and antibiotics were adjusted. She passed a soft, mushy stool 1 time today. She has complained about discomfort in her abdomen after eating and GI has been asked to see the patient regarding further evaluation.   PAST MEDICAL HISTORY: 1.  GERD.  2.  COPD.  3.  CVA.   4.  Alzheimer's dementia.  5.  Chronic facial pain syndrome.  6.  Anxiety/depression.  7.  Hypertension.  8.  Hyperlipidemia.  9.  Polycystic kidney disease.   PAST SURGICAL HISTORY: 1.  Left lower leg extremity fracture with open reduction internal fixation.  2.  Total hysterectomy.  3.  Cataract removal.  4.  Appendectomy.  5.  History of bowel resection.   HOME MEDICATIONS: See admission history and physical.   ALLERGIES:  1.  LISINOPRIL CAUSED COUGH.  2.  ALLERGIC TO STATINS.   HABITS: Negative tobacco.   REVIEW OF SYSTEMS: The patient says she feels pretty well today. She has noted no fevers or chills. She has had a cough but says she is not short of breath. No chest pain or pressure. She said she ate pretty well and nursing confirmed she ate 100% of her breakfast without nausea or vomiting. The patient ate all of her liquids for lunch and some of her pureed food. No vomiting. She says sometimes her abdomen hurts,  nonspecific. She denies diarrhea. Remaining 10 systems otherwise negative.   PHYSICAL EXAMINATION:  VITAL SIGNS:  Temperature 97.7, 75, 18, 152/72, pulse ox on room air is 96%.  GENERAL: Elderly Caucasian female resting in bed, laying quite flat, looks comfortable.  HEENT: Head is normocephalic. Conjunctivae pink. Sclerae anicteric. Oral mucosa is moist and intact.  NECK: Supple. Trachea is midline.  HEART: Heart tones S1, S2.  LUNGS: Have generalized crackles, some rhonchi, occasional cough noted.  ABDOMEN: Soft, nontender in all quadrants, even with deep palpation. Bowel sounds are present.  EXTREMITIES: Lower extremities without edema.  SKIN: Warm and dry. Color is somewhat pale.  MUSCULOSKELETAL: No joint swelling or inflammation.  NEUROLOGIC: The patient is a little hard of hearing. Oriented to person and place, and she says she remembers me from 10 years ago. She is answering questions appropriately and assists with position changes in bed.   LABORATORY, DIAGNOSTIC AND RADIOLOGICAL STUDIES:  1.  Pertinent laboratory studies today show a glucose of 95, BUN 17, creatinine 1.06. C. difficile stool is positive. Albumin 3.3, liver panel otherwise unremarkable. WBC is 4.8, hemoglobin 12.8. Blood cultures on admission positive for Staphylococcus epidermis which has been treated with azithromycin,  cefuroxime, levofloxacin.  2.  Radiology:  Chest x-ray, PA and lateral, on 03/29/2013 shows no acute cardiopulmonary process. Positive chronic pulmonary scarring and cardiomegaly.   IMPRESSION: The patient was admitted to the hospital with acute mental status change/metabolic encephalopathy, most likely due to infection. It was thought that she had  a pneumonia. The patient has been treated with several antibiotics. She developed loose stools yesterday, and only 1 soft, mushy school today, but she did return positive for C. difficile. She has complained of nonspecific abdominal discomfort and has a totally  normal abdominal exam today.    PLAN:  1.  We will treat with vancomycin 250 mg 4 times daily. This is a liquid and, if for some reason, she is not able to tolerate it then would recommend Dificid. I would not recommend Flagyl on this patient due to the side effects of nausea, vomiting, abdominal discomfort and the possibility of drug resistance on this higher-risk elderly individual who has had nursing home exposure.  2.  Continue to monitor her progress. No indication for luminal evaluation at this time.  3. This case was discussed with Dr. Mechele CollinElliott in collaboration of care. This case was also discussed with Dr. Daniel NonesBert Klein. Thank you for the consultation.   These services were provided by Amedeo KinsmanKimberly Kimika Streater, ANP.  ____________________________ Ranae PlumberKimberly A. Arvilla MarketMills, ANP (Adult Nurse Practitioner) kam:cs D: 03/31/2013 15:25:38 ET T: 03/31/2013 15:52:09 ET JOB#: [id number]  cc: Cala BradfordKimberly A. Arvilla MarketMills, ANP (Adult Nurse Practitioner), <Dictator> Ranae PlumberKimberly A. Suzette BattiestMills RN, MSN, ANP-BC Adult Nurse Practitioner ELECTRONICALLY SIGNED 03/31/2013 17:22

## 2014-07-17 NOTE — Discharge Summary (Signed)
PATIENT NAME:  Cynthia Glenn, Cynthia Glenn MR#:  [id number] DATE OF BIRTH:  [birth year]  DATE OF ADMISSION:  03/24/2013 DATE OF DISCHARGE:  04/04/2013  PRIMARY CARE PHYSICIAN: Daniel NonesBert Klein, M.D.   DISCHARGE DIAGNOSES: 1. Clostridium difficile colitis.  2. Metabolic encephalopathy.  3. Aspiration pneumonia.   DISCHARGE MEDICATIONS: 1. Acetaminophen-hydrocodone 325/5 mg 1 tab t.i.d. p.r.n.  2.  Seroquel XR 150 mg once daily.  3.  Acidophilus 1 tab daily.  4.  Amlodipine 10 mg daily.  5.  Atenolol 100 mg daily.  6.  Cranberry extract 1 capsule daily.  7.  Dexilant delayed-release 60 mg 1 capsule daily.  8.  Diovan 320 mg daily.  9.  Duloxetine delayed release 30 mg capsule once daily.  10.  Fluticasone nasal spray 50 mcg inhaler 2 sprays once daily.  11.  Nitrofurantoin 100 mg capsule once daily.  12.  Oyster shell calcium carbonate 500/1250, one tab daily.  13.  Vitamin B12 500 mcg once daily.  14.  Docusate sodium 100 mg capsule 1 cap twice daily.  15.  Pravastatin 20 mg at bedtime.  16.  Donepezil 10 mg at bedtime.  17.  Aspirin 81 mg daily.  18.  Vitamin D 2000 units daily.  19.  Prednisone 10 mg, take 3 tabs once daily, decrease x 1 tab every 2 days.  20.  Benzonatate 100 mg 1 cap t.i.d. p.r.n. cough.  21.  Lorazepam 0.5 mg tablet Glenn.i.d.  22.  Albuterol 2.5 mg inhaled every 6 hours p.r.n. wheeze, shortness of breath.  23.  Vancomycin oral solution 250 mg/5 mL, take 5 mL q. 6 hours x 8 days.  24.  Azelastine Nasal spray both nostrils q. 12 hours for 1 week.  25.  Hydralazine 25 mg 1 tab twice daily.   HISTORY OF PRESENT ILLNESS: This is a 81 year old female with history of hypertension, COPD, prior CVA, brought into the ED by family with concerns of confusion. Initial chest x-ray notable for bilateral lower lobe infiltrate. The patient was admitted and initiated on antibiotics for a finding of aspiration pneumonia.   HOSPITAL COURSE: Altered mental status was attributed to metabolic  encephalopathy in the setting of pneumonia. She had been followed previously by Psychiatry and was being treated with donepezil for dementia. Pulmonary status gradually improved over the course of admission. The patient was noted to develop loose stools and was tested on 01/06 for C. difficile colitis, which was positive. The patient was started on oral vancomycin as treatment for this. She showed gradual improvement in pulmonary status and mental status returned to baseline. The patient was discharged to [facility] skilled nursing facility at discharge.   PERTINENT STUDIES:   1.  Portable chest x-ray 03/24/2013, notable for mild cardiomegaly with basilar mild infiltrates and pleural effusions.  2.  CT of the head, non-con 03/24/2013, showed no acute intracranial pathology.  3.  Echocardiogram 03/25/2013, notable for left ventricular ejection fraction 55% to 60%, normal global left ventricular systolic function, mild left ventricular hypertrophy, mildly dilated left atrium, mild mitral valve regurgitation, mild increased left ventricular posterior wall thickness, mild tricuspid regurgitation.   DISCHARGE INSTRUCTIONS: 1.  The patient is to continue all antibiotics as directed.  2.  The patient is to taper dose of prednisone as directed.  3.  The patient is to follow up with primary care physician as directed    ____________________________ A. Wendall MolaMelissa Zakariyya Helfman, MD ams:dp D: 04/04/2013 09:36:55 ET T: 04/04/2013 10:02:26 ET JOB#: [id number]  cc: A. Melissa Khyri Hinzman,  MD, <Dictator> Lynnea Ferrier, MD Caleen Jobs Zury Fazzino MD ELECTRONICALLY SIGNED 04/06/2013 17:04

## 2014-07-17 NOTE — Consult Note (Signed)
Pt With C. diff colitis, still with at least 3 loose stools in last 24 hours.  Started on necter thick liquids per SP after MBSS.  VSS afebrile, sat 93% on room air. WBC 7.4.  No new suggestions, I expect diarrhea to improve over weekend.  Continue isolation.  Electronic Signatures: Scot JunElliott, Robert T (MD)  (Signed on 08-Jan-15 16:18)  Authored  Last Updated: 08-Jan-15 16:18 by Scot JunElliott, Robert T (MD)

## 2014-07-17 NOTE — Consult Note (Signed)
Pt awake and responds to requests to breath deep, etc.  Is demented but pleaseant.  Had 3 loose stools last night and another one recently.  Dr. Graciela HusbandsKlein note noted and she is now off antibiotics for her pneumonia.  Should be adequate treatment but if she did need to take a prolonged course it could be done provided we continue the vancomycin for at least 10-14 days after the pneumonia antibiotics are finished.  Discussed with daughter.  Abd with minimal tenderness on palpation.  Electronic Signatures: Scot JunElliott, Leticia Coletta T (MD)  (Signed on 07-Jan-15 14:53)  Authored  Last Updated: 07-Jan-15 14:53 by Scot JunElliott, Joseguadalupe Stan T (MD)

## 2014-07-17 NOTE — Consult Note (Signed)
See note from NP Fransico SettersKim Mills.  Pt admitted with signif altered mental status, had blood cult pos for staph, over weekend developed diarrhea up to 5 times a day with watery stool.  Today started forming up.  Stool tested positive for C. diff. and consult for this.  Pt awake and appropriate but demented and does not know where she is or what city she is in but knows her daughter but not able to say her name.  We recommend vancomycin 250mg  qid for 14 days and probiotic one twice a day and yogurt bid if possible.  Minimum likely relapse is 20%.  No colonoscopy needed.  Will follow with you.  Electronic Signatures: Scot JunElliott, Marialuiza Car T (MD)  (Signed on 06-Jan-15 16:19)  Authored  Last Updated: 06-Jan-15 16:19 by Scot JunElliott, Atlantis Delong T (MD)

## 2014-07-17 NOTE — Consult Note (Signed)
CC: C. diff diarrhea.  Pt had 3-4 loose stools today per nurse.  Pt is demented and denies any diarrhea.  Abd not tender.  Nurse confirmed the patient was taking her vancomycin.  No new recommendations.  Electronic Signatures: Scot JunElliott, Gilbert Narain T (MD)  (Signed on 09-Jan-15 19:30)  Authored  Last Updated: 09-Jan-15 19:30 by Scot JunElliott, Marlet Korte T (MD)

## 2014-07-17 NOTE — Consult Note (Signed)
Pt with no tenderness today on my exam.  She is going to skilled nursing home.  Recommend continue vancomycin oral for total of at least 14 days.    Electronic Signatures: Scot JunElliott, Robert T (MD)  (Signed on 10-Jan-15 13:32)  Authored  Last Updated: 10-Jan-15 13:32 by Scot JunElliott, Robert T (MD)

## 2014-07-18 NOTE — Consult Note (Signed)
VSS afebrile, at this moment she denies nausea or vomiting, is asking when she can return to where she lived before hospitalization.  Picked at her food but ate very little.  No new suggestions.  Seems to be somewhat improved.  Electronic Signatures: Scot JunElliott, Robert T (MD)  (Signed on 17-Jun-13 17:23)  Authored  Last Updated: 17-Jun-13 17:23 by Scot JunElliott, Robert T (MD)

## 2014-07-18 NOTE — Consult Note (Signed)
PATIENT NAME:  Cynthia Glenn, Dannah B MR#:  [id number] DATE OF BIRTH:  Nov 04, 1920  DATE OF CONSULTATION:  09/09/2011  REFERRING PHYSICIAN:  Marisue IvanKanhka Linthavong, MD   CONSULTING PHYSICIAN:  Scot Junobert T. Elliott, MD  REASON FOR CONSULTATION: The patient is an 81 year old white female resident of [facility], was brought to the ER because the family noticed the patient was acting differently. She was less responsive than usual, less active than usual, and had a fall. The patient has underlying Alzheimer's dementia, but she was noted to be acting more different than usual. She was found to have a urinary tract infection. She was started on Rocephin for this and was switched to Levaquin. The family was a little uncomfortable with returning her to her nursing home just yet. I was asked to see her in consultation. The patient was seen with family present. The patient has been seen by me down through the years previously and has had previous colonoscopy attempt. The patient's family says the patient has spit up a small amount of green stomach juice twice in the last 24 hours and does not want anything to eat.    ALLERGIES: Lisinopril and statins.   PAST MEDICAL HISTORY:  1. Mild chronic obstructive pulmonary disease.  2. Gastroesophageal reflux disease.  3. Chronic back pain and hip pain.  4. Polycystic kidney disease.  5. B12 deficiency.  6. Hyperlipidemia.  7. Dementia, Alzheimer's type.  8. Diverticulosis.  9. Hypertension.  10. Peripheral vascular disease.  11. Recurrent urinary tract infections, sometimes causing nausea and lethargy.  12. Previous cerebrovascular accident.   PAST SURGICAL HISTORY:  1. Diverticulitis surgery with Dr. Michela PitcherEly.  2. Hysterectomy. 3. Hernia repair.  4. Ankle fracture.  5. Bilateral cataract repair.   MEDICATIONS ON ADMISSION:  1. Amlodipine 10 mg a day.  2. Aspirin 81 mg a day.  3. Atenolol 100 mg a day.  4. Cymbalta 30 mg a day.  5. Cystex 250 mL daily.  6. Diovan 160  mg a day.  7. Donepezil 5 mg a day. 8. Fluticasone 50 mcg spray each nostril daily.  9. Gabapentin 300 mg b.i.d.  10. Lorazepam 0.5 mg b.i.d. p.r.n. every 8 hours. 11. Nitrofurantoin 100 mg a day.  12. Omeprazole 20 mg a day.  13. Pravastatin 20 mg at bedtime.  14. Ropinirole 1 mg daily.  15. Seroquel 150 mg a day.  16. Tramadol 50 mg p.r.n.  17. Vitamin B12 500 mcg a day.   ALLERGIES: Lisinopril and statins.   REVIEW OF SYSTEMS: No chest pains. No abdominal pain. No shortness of breath. No wheezing. No melena. No hematemesis.   PHYSICAL EXAMINATION:  VITAL SIGNS: Temperature 97.5, pulse 56, blood pressure 150/75, pulse oximetry 95%.   GENERAL: An elderly white female who is alert and oriented to being in the hospital, remembers seeing me as a previous patient.   HEENT: Sclerae anicteric. Conjunctivae negative. Tongue negative. Head is atraumatic.   CHEST: Clear.   HEART: Heart shows a 1/6 systolic murmur.   ABDOMEN: Bowel sounds are present. No hepatosplenomegaly. No masses. No bruits. No significant tenderness.   SKIN: Warm and dry.   LABORATORY, DIAGNOSTIC AND RADIOLOGICAL DATA:  BUN on admission was 40, BUN yesterday was 15, today was 23. Creatinine on admission 1.45, today is 0.89. Sodium 139, potassium 3.7, chloride 104, CO2 25, magnesium 2.4, total protein 7, albumin 3.1, total bilirubin 0.5, alkaline phosphatase 86, SGOT 43, SGPT 38, white count 7.6, hemoglobin 14.8, platelet count 231.  Urinalysis showed  Klebsiella and another unidentified organism sensitive to most antibiotics except ampicillin.  A portable chest x-ray showed mild basilar opacities likely secondary to atelectasis. They recommended a follow-up PA and lateral of the chest. Mild prominence of the right hilum possibly rotational in nature.  CAT scan of the head showed chronic small vessel ischemic disease.   ASSESSMENT/RECOMMENDATIONS: The patient's symptoms seemed to start when she had a urinary tract  infection and had some dehydration with an elevated BUN, which came down in the hospital. She did have a urinary tract infection. Family says that she often gets symptoms of central nervous system dysfunction when she gets urinary tract infections. Several medications have been held since she was admitted to the hospital. It is possible that withdrawal of these medications could also be playing a role. They have held gabapentin, lorazepam, Seroquel, Requip and tramadol. It is possible that the absence one or more of these medications could be contributing to her nausea, although much more likely to be residual effects of urinary tract infection. Given her emesis of green material, clearly she is not having an upper gastrointestinal bleed at this time. Given the minimal amount of vomiting and her soft nontender abdomen, I do not feel an upper endoscopy is necessary at this time. I had a discussion with the family and explained that these could well be residual effects of urinary tract infection and dehydration and that she may improve with continued therapy in another 2 or 3 days. She was started on omeprazole twice a day today as well as some Reglan. We will see if these help in the next few days.  I will follow with you.  ____________________________ Scot Jun, MD rte:cbb D: 09/09/2011 13:41:35 ET T: 09/09/2011 14:50:14 ET JOB#: [id number] cc: Lynnea Ferrier, MD Scot Jun MD ELECTRONICALLY SIGNED 09/18/2011 16:10

## 2014-07-18 NOTE — Discharge Summary (Signed)
PATIENT NAME:  Cynthia Glenn, Cynthia Glenn MR#:  [id number] DATE OF BIRTH:  [birth year]  DATE OF ADMISSION:  03/21/2011 DATE OF DISCHARGE:  03/26/2011  DISCHARGE DIAGNOSES:   1. Urinary tract infection.  2. Fall with neck muscle strain.  3. Dementia.  4. Chronic obstructive pulmonary disease.  5. Allergic rhinitis.  6. Gastroesophageal reflux disease.  7. Degenerative joint disease with chronic pain syndrome.  8. History of polycystic kidney disease.  9. Vitamin B12 deficiency.  10. Hyperlipidemia.  11. Alzheimer's type dementia with anxiety and behavioral disturbance.  12. Hypertension.  13. Peripheral vascular disease.  14. Vitamin D deficiency.  15. History of prior frontal and parietal lacunar infarction.  16. Status post hysterectomy.  17. History of hernia repair.  18. Partial colectomy for diverticulitis.  19. Left bimalleolar ankle fracture repair.  20. History of bilateral cataract repairs.   HISTORY AND PHYSICAL: Please see dictated admission History and Physical.   HOSPITAL COURSE: The patient was admitted after a fall and decreased ability to ambulate. She complained of some neck pain, also shoulder pain. Multiple films were obtained which revealed diffuse arthritis, but fortunately no fracture. There was a question of the possibility of rotator cuff tear on the right based on the humeral head sitting somewhat high, but she was noted to have good range of motion in her arm after the initial soreness improved. She was found to have evidence of urinary tract infection and was treated for this, converted over to oral antibiotics. When she did ambulate, however, her ability to get herself out of bed as well as to get from point to point was noted to be severely limited, probably secondary to discomfort; and it was felt that she was no longer safe to return to her prior living situation. It was recommended that short-term rehabilitation be arranged, and so a bed search began, and a bed became  available.   DISCHARGE INSTRUCTIONS: She will now be discharged to that facility in stable condition with physical activity to be up with a walker with assistance as tolerated. She will need to be on fall precautions. Her diet should be no added salt. Physical Therapy should evaluate and treat the patient. Recommendation for a urinalysis in one week with results to the nursing home physician.   DISCHARGE MEDICATIONS:  1. Amlodipine 10 mg p.o. daily.  2. Enteric-coated aspirin 81 mg p.o. daily.  3. Atenolol 100 mg p.o. daily.  4. Miacalcin 1 spray daily, alternate nostrils, for osteoporosis.  5. Cymbalta 30 mg p.o. daily.  6. Diovan 160 mg p.o. daily.  7. Aricept 5 mg p.o. at bedtime.  8. Flonase 1 spray each nostril daily.  9. Neurontin 300 mg p.o. Glenn.i.d. for peripheral neuropathy related to her chronic pain syndrome.  10. Ativan 0.5 mg p.o. Glenn.i.d. as a standing dose, and in addition Ativan 0.5 mg p.o. every 8 hours p.r.n. severe anxiety.  11. Omeprazole 20 mg p.o. daily.  12. Calcium Plus D 500 mg p.o. daily.  13. Pravastatin 20 mg p.o. at bedtime.  14. Seroquel XR 150 mg p.o. daily.  15. Tramadol 50 mg p.o. every 6 hours p.r.n. severe pain.  16. Vitamin B12 500 mcg p.o. daily.  17. Septra DS 1 p.o. Glenn.i.d. x3 days to complete an antibiotic course.  18. Requip 1 mg p.o. at bedtime p.r.n. restless leg syndrome.   CODE STATUS: During this patient's hospitalization, she was DO NOT RESUSCITATE in accordance with her prior wishes.   TIME SPENT ON DISCHARGE: 40  minutes. ____________________________ Lynnea Ferrier, MD bjk:cbb D: 03/26/2011 12:33:45 ET T: 03/26/2011 12:48:46 ET JOB#: [id number] Daniel Nones MD ELECTRONICALLY SIGNED 03/28/2011 7:50

## 2014-07-18 NOTE — Consult Note (Signed)
Patient asleep, spoke with family member who told me that the patient ate well this morning without nausea and no vomiting.  I think she is baseline now and I will sign off.  Reconsult if I can be of service.  Electronic Signatures: Scot JunElliott, Robert T (MD)  (Signed on 18-Jun-13 11:59)  Authored  Last Updated: 18-Jun-13 11:59 by Scot JunElliott, Robert T (MD)

## 2014-07-18 NOTE — H&P (Signed)
PATIENT NAME:  Cynthia Glenn, Cynthia Glenn MR#:  [id number] DATE OF BIRTH:  1920-12-27  DATE OF ADMISSION:  03/21/2011  ED REFERRING PHYSICIAN: Joseph ArtAlice Finnell, MD  PRIMARY CARE PHYSICIAN: Daniel NonesBert Klein, MD  CHIEF COMPLAINT: Status post fall, generalized weakness, and cough.   HISTORY OF PRESENT ILLNESS: The patient is an 81 year old white female who currently resides at [facility] nursing facility who was sent from the nursing facility due to a fall. The patient apparently went to get her clothes from a closet were she fell. She was sent here for further evaluation. In the ED, the patient was noted to have a urinary tract infection. She was also complaining of generalized weakness. The patient otherwise also was noted to have low oxygen on ABG. Chest x-ray showed possible congestive heart failure; however, a CT scan of the chest was done which was negative. The patient currently complains of generalized weakness and complains of nonproductive cough, but no fevers or chills. She does have recurrent urinary tract infections. She denies any urinary frequency, urgency, or hesitancy, and no burning. She reports occasional chest pain not related to activity. She denies any abdominal pain, nausea, vomiting, or diarrhea.   PAST MEDICAL HISTORY:  1. Allergic rhinitis.  2. Chronic obstructive pulmonary disease with asthmatic component.  3. Gastroesophageal reflux disease.  4. Chronic back and hip pain.  5. History of polycystic kidney disease.  6. Vitamin B12 deficiency.  7. Hyperlipidemia.  8. Dementia of the Alzheimer's time.  9. Hypertension.  10. Peripheral vascular disease.  11. Recurrent urinary tract infections.  12. Vitamin D deficiency. 13. Previous history of frontal parietal lacunar infarct in November.  14. Questionable history of seizure, according to her daughter, in 2009.   PAST SURGICAL HISTORY:  1. Status post hysterectomy. 2. Hernia repair. 3. Partial colectomy for diverticulitis in the past.   4. History of left bimalleolar ankle fracture repair.  5. Bilateral cataract extractions.  DRUG ALLERGIES: The patient cannot take lisinopril because of cough. She has history of intolerance to some statin drugs.   CURRENT MEDICATIONS:  1. Amlodipine 10 mg daily.  2. Aspirin 81 mg one tab p.o. daily.  3. Atenolol 100 mg daily.  4. Calcitonin 200 international units one spray daily to alternate nostril.  5. Cymbalta 30 mg daily.  6. Diovan 160 mg daily.  7. Benazepril 5 mg daily.  8. Fluticasone 50 mcg two sprays to each nostril once daily.  9. Gabapentin 300 mg twice a day. 10. Lorazepam 0.5 mg one tab p.o. twice a day. 11. Lorazepam 0.5 mg one orally daily as needed for anxiety. 12. Nitrofurantoin 100 mg one tab daily.  13. Omeprazole 20 mg daily.  14. Oyster Shell calcium with vitamin D 1 tab p.o. daily.  15. Pravastatin 20 mg daily.  16. Promethazine 25 mg every 6 hours p.r.n. 17. Q-cap 325 mg 2 tabs every four hours p.r.n. 18. Q-Tussin 1 tablespoon every six hours as needed.  19. Seroquel XR 150 mg daily.  20. Thera-Gesic extra-plus topical cream to legs four times daily.  21. Tramadol 50 mg two tabs every 6 hours p.r.n.  22. [name] 1,000 mcg daily.   SOCIAL HISTORY: Nonsmoker. No alcohol or drug use.   FAMILY HISTORY: Positive for hypertension.   REVIEW OF SYSTEMS: CONSTITUTIONAL: Denies any fever or fatigue. Complains of weakness. Has chronic back pain and hip pain. No weight loss. No weight gain. EYES: No blurred or double vision. No pain. No redness. No inflammation. No glaucoma. ENT:  No tinnitus. No ear pain. No hearing loss. Does have allergies. No epistaxis.  No nasal discharge. No snoring. No postnasal drip. RESPIRATORY: Has cough. No wheezing. No hemoptysis. Denies dyspnea. Has chronic obstructive pulmonary disease with asthmatic component. No painful respiration. No tuberculosis. CARDIOVASCULAR: Chest pain as above. No orthopnea. Does have history of asymmetrical  edema, according to her daughter. No arrhythmia. No palpitations. No syncope. Does have high blood pressure. No varicose veins. GI: No nausea, vomiting, or diarrhea. No abdominal pain. No hematemesis. No melena. No ulcer. GU: Denies any dysuria, hematuria, or renal calculus. ENDOCRINE: Denies polyuria, nocturia, or thyroid problems. No increase in sweating, heat or cold intolerance. HEME/LYMPH: Denies any anemia, easy bruisability, or bleeding. SKIN: No acne. No rash or lesions. No change in mole or hair. MUSCULOSKELETAL: Has pain in the neck and back. NEUROLOGIC: No numbness. Has chronic weakness. No dysarthria. PSYCHIATRIC: History of anxiety. No insomnia. No bipolar. No depression.   PHYSICAL EXAMINATION:   VITAL SIGNS: Temperature 98.1, pulse 63, respiratory rate 20, blood pressure 153/60, and O2 saturation 95% on room air.   GENERAL: The patient is an 82 year old obese white female who is currently not in any acute distress.   HEENT: Head atraumatic, normocephalic. Pupils are equally round and reactive to light and accommodation. Extraocular movements are intact. Nasal exam shows no drainage or ulceration. Oropharynx is without any exudates. No ulceration. Ear exam shows no erythema or swelling.   NECK: No thyromegaly. No carotid bruits.   HEART: Regular rate and rhythm. No murmurs, rubs, clicks, or gallops. PMI is not displaced.   LUNGS: No accessory muscle usage. Clear to auscultation bilaterally without any rales, rhonchi, or wheezing.   ABDOMEN: Soft, nontender, and nondistended. Positive bowel sounds x4. No hepatosplenomegaly.   EXTREMITIES: Nonpitting edema of the lower extremities. Good DP and PT pulses.   VASCULAR: Good pulses in her lower extremities.   LYMPHATICS: No lymph nodes palpable.   MUSCULOSKELETAL: No erythema or swelling.   SKIN: No rash.  NEURO: Cranial nerves II through XII grossly intact. Strength is diminished in all four extremities due to generalized  weakness. Reflexes 2+.   LYMPHATICS: No lymph nodes are palpable.   LABS/STUDIES: In the ED the patient had an ABG that showed a pH of 7.40, pCO2 46, PO2 71, and FiO2 was on room air.   CT scan of chest: No evidence of alveolar pneumonia. Very minimal increased interstitial density in the subpleural location. There is enlargement of cardiac chambers. No pericardial pleural effusion.   Urinalysis shows nitrites positive, leukocytes 3+, and WBCs 60.  PA and lateral chest x-ray shows cardiomegaly with mild interstitial possible edema.   X-ray of the right shoulder shows no fracture.  X-ray of knees: Moderate degenerative joint disease of the left knee. Right knee shows moderate degenerative joint disease.   CT of C-spine: No evidence of acute cervical spine abnormality.   CT scan of head: Age-related atrophic changes.   WBC 5.5, hemoglobin 13.8, and platelet count 164. BMP: Glucose is normal, BUN 19, creatinine 1.18, sodium 144, and potassium 3.9. LFTs: Normal LFTs. Troponin less than 0.02. BNP 283.   EKG: First degree AV block with low voltage QRS in the inferior and lateral leads. Nonspecific ST-T wave changes.   ASSESSMENT AND PLAN: The patient is an 81 year old white female who was at a skilled nursing facility where she had a fall who comes in with generalized weakness and noted to have a urinary tract infection.  1. Fall with  weakness, likely due to a urinary tract infection: At this time, we will admit the patient, get urine cultures, and place her on antibiotics with ceftriaxone. Adjust her antibiotic regimen based on urine cultures.  2. Hypoxia, based on ABG: The patient has no shortness of breath. Chest x-ray shows possible congestive heart failure. CT of the chest shows no evidence of any abnormality, except some nonspecific peripheral changes. At this time, the patient clinically appears euvolemic and does not appear in congestive heart failure. We will go ahead and check a BNP.  The patient does have a cough. She could have possible bronchitis. She is already on ceftriaxone for urinary tract infection.  3. Chronic obstructive pulmonary disease: No evidence of exacerbation. We will give nebulizers p.r.n.  4. Hypertension: Continue amlodipine and atenolol and hold her medications as needed.  5. Hyperlipidemia: Continue pravastatin as taking at home.  6. Gastroesophageal reflux disease: Continue PPIs.   CODE STATUS: DO NOT RESUSCITATE, confirmed with the family. The patient does not have a yellow route paper with her at this point, but she is DO NOT RESUSCITATE.   TIME SPENT: 35 minutes. ____________________________ Lacie Scotts Allena Katz, MD shp:slb D: 03/21/2011 13:44:00 ET T: 03/21/2011 14:04:50 ET JOB#: [id number]  cc: Rebie Peale H. Allena Katz, MD, <Dictator> Lynnea Ferrier, MD Charise Carwin MD ELECTRONICALLY SIGNED 03/31/2011 15:19

## 2014-07-18 NOTE — Discharge Summary (Signed)
PATIENT NAME:  Cynthia Glenn, Cynthia Glenn MR#:  [id number] DATE OF BIRTH:  1920-08-23  DATE OF ADMISSION:  09/04/2011 DATE OF DISCHARGE:  09/14/2011  FINAL DIAGNOSES:  1. Metabolic encephalopathy secondary to urinary tract infection.  2. Urinary tract infection secondary to Klebsiella pneumoniae, resistant to ampicillin.  3. Chronic obstructive pulmonary disease with mild asthma component.  4. Gastroesophageal reflux disease with chronic abdominal pain.  5. Chronic back and hip pain.  6. Polycystic kidney disease.  7. B12 deficiency.  8. Hyperlipidemia.  9. Dementia, Alzheimer's type.  10. Hypertension.  11. Peripheral vascular disease.  12. Osteoarthritis.  13. History of prior cerebrovascular accident.    HISTORY AND PHYSICAL: Please see dictated admission History and Physical.   HOSPITAL COURSE: The patient was admitted with altered mental status, which has been similar to presentation she has had with urinary tract infections in the past. She was found to have Klebsiella urinary tract infection, and was placed on treatment for this. Antibiotics were adjusted based on sensitivities, and she wound up on Levaquin orally.   Her mental status improved significantly. However, she began complaining of increasing abdominal pain and increased anxiety. She has been on an extensive regimen of antianxiety medications, which have been developed through psychiatry, and these medications were initially held when she came in due to sedation and confusion. There were slowly reintroduced and her mental status appeared to improve further. Furthermore, her nausea seemed to get better with this. GI saw the patient and did not have any further recommendations.   It was felt that she would not be able to return to her prior level of care due to an increasing need for assistance to get out of bed. Therefore, a bed search began and a bed became available. She will be discharged now to a skilled nursing facility in stable  condition with physical activity to be up with assistance with a walker as tolerated.  Her diet should be no added salt. Physical therapy and occupational therapy should evaluate and treat the patient. She will need a MET-B and CBC in one week with results to the nursing home physician.   DISCHARGE MEDICATIONS:  1. Flonase one spray each nostril daily.  2. Macrobid 100 mg p.o. daily for suppressive therapy for urinary tract infections.  3. Calcium 500 mg p.o. daily.  4. Cymbalta 30 mg p.o. daily.  5. Pravastatin 20 mg p.o. at bedtime.  6. Seroquel XR 150 mg p.o. at bedtime.  7. Vitamin B12 500 mcg p.o. daily.  8. Atenolol 100 mg p.o. daily.  9. Amlodipine 10 mg p.o. daily.  10. Donepezil 5 mg p.o. at bedtime.  11. Aspirin 81 mg p.o. daily.  12. Lorazepam 0.5 mg p.o. b.i.d.  13. Neurontin 300 mg p.o. b.i.d. for peripheral neuropathy.  14. Colace 100 mg p.o. twice a day, hold for loose stools. 15. Levaquin 250 mg p.o. daily times four days.  16. Diovan 320 mg p.o. daily.  17. Pantoprazole 40 mg p.o. daily.  18. Tramadol 50 mg p.o. every six hours p.r.n. severe pain.     ____________________________ Adin Hector, MD bjk:bjt D: 09/14/2011 07:50:40 ET T: 09/14/2011 08:12:29 ET JOB#: [id number]  cc: Adin Hector, MD, <Dictator> Ramonita Lab MD ELECTRONICALLY SIGNED 09/20/2011 8:56

## 2014-07-18 NOTE — H&P (Signed)
PATIENT NAME:  Cynthia Glenn, Cynthia Glenn MR#:  [id number] DATE OF BIRTH:  11-23-1920  DATE OF ADMISSION:  09/04/2011  PRIMARY CARE PHYSICIAN: [facility]onesBert Klein at [facility]  HISTORY OF PRESENT ILLNESS: This is an 81 year old female resident of [facility] that was brought to the Emergency Room because the staff there noticed that she was acting differently. She was less responsive than usual and less active than usual. No history of fever, cough, nausea, vomiting, diarrhea, or other complaints. Patient has underlying dementia, Alzheimer's type, is unable to answer questions appropriately. Family members in the room and are able to give the history.   REVIEW OF SYSTEMS: As stated above.   PAST MEDICAL HISTORY:  1. Chronic obstructive pulmonary disease with asthma component. 2. Allergic rhinitis.  3. Gastroesophageal reflux disease.  4. History of chronic back and hip pain.  5. History of polycystic kidney disease.  6. History of B12 deficiency.  7. History of hyperlipidemia.  8. History of dementia, Alzheimer's type. 9. History of diverticulosis.  10. History of hypertension.  11. History of peripheral vascular disease. 12. History of recurrent urinary tract infections. 13. History of osteoarthritis.  14. History of cerebrovascular accident.   PAST SURGICAL HISTORY:  1. Surgery for diverticulitis per Dr. Michela PitcherEly. 2. Hysterectomy. 3. Hernia repair. 4. Ankle fracture repair. 5. Bilateral cataract repair.   MEDICATIONS:  1. Amlodipine 10 mg p.o. daily.  2. Aspirin 81 mg p.o. daily.  3. Atenolol 100 mg p.o. daily.  4. Betamethasone/clotrimazole apply Glenn.i.d.  5. Cymbalta 30 mg p.o. daily.  6. Cystex 250 mL p.o. daily.  7. Diovan 160 mg p.o. daily.  8. Donepezil 5 mg p.o. daily.  9. Fluocinonide 0.05% topical cream Glenn.i.d.  10. Fluticasone 50 mcg 1 spray each nostril daily.  11. Gabapentin 300 mg Glenn.i.d.   12. Lorazepam 0.5 mg Glenn.i.d. and as needed every eight hours.  13. Nitrofurantoin 100 mg  p.o. daily.  14. Omeprazole 20 mg p.o. daily.  15. Oyster shell calcium 1 tab p.o. daily. 16. Pravastatin 20 mg p.o. at bedtime.  17. Ropinirole 1 mg p.o. daily as needed.  18. Seroquel 150 mg p.o. daily.  19. Tramadol 50 mg p.o. as directed as needed.   20. Vitamin B12 500 mcg p.o. daily.   ALLERGIES: Lisinopril and statins.   SOCIAL HISTORY: Lives at [facility]. History of remote tobacco use. No alcohol use.   FAMILY HISTORY: Asthma, stroke, breast cancer, coronary artery disease.   PHYSICAL EXAMINATION:  VITAL SIGNS: Temperature 98.6, pulse 59, blood pressure 107/40, respiratory rate 18, 95% o room air.   GENERAL: This is a elderly white female with moderate to severe dementia, unable to answer questions appropriately, in no apparent distress.   HEENT: Extraocular movements intact. Pupils equal, and reactive to light and accommodation.   NECK: Supple neck exam.   CARDIOVASCULAR: Regular rate and rhythm.   RESPIRATORY: Clear to auscultation bilaterally. No rhonchi. No increased work of breathing.   ABDOMEN: Soft, nontender, nondistended. No rebound or guarding.   SKIN: Normal color. Normal turgor. No cyanosis.   NEUROLOGIC: She is alert but not oriented to place or time. Cranial nerves difficult to evaluate because of her underlying dementia but no focal deficits on exam.   PSYCH: No signs of mania. No signs of psychosis.   LABORATORY, DIAGNOSTIC AND RADIOLOGICAL DATA: CT of the head was negative for acute findings. Chest x-ray showed mild basilar opacities. Urinalysis showed positive nitrites, but negative white blood cell count. White blood  cell count 5.8, hemoglobin 13.4, platelets 176. Troponins were negative. Sodium 142, potassium 4.7, BUN 40, creatinine 1.45, glucose 92.   ASSESSMENT AND PLAN: This is a 81 year old female being admitted with acute altered mental status.  1. Altered mental status, new onset. Unclear etiology at this point. Urinalysis has some nitrites but  micro shows no bacteria. She does have a history of recurrent urinary tract infections. Awaiting urine cultures. She is currently being treated with ceftriaxone to cover both possible pneumonia and urinary tract infection at this time. Plan to hold any altering mental status medications. Plan to hold the gabapentin, the lorazepam, the Seroquel and the Requip and the tramadol at this time. Will continue with the Cymbalta. I discussed this with the family members and they are in agreement. Nursing staff will need to watch closely make sure she has no withdrawal symptoms coming off the lorazepam.  2. Other chronic medical issues: For her chronic obstructive pulmonary disease, appears stable at this time. Chest x-ray was negative.  3. Allergic rhinitis. Continue on her home regimen fluticasone.  4. For her gastroesophageal reflux disease will continue with her home regimen.  5. For chronic back and hip pain will treat her with Tylenol while she is in the hospital. Will try to hold off on any narcotics or tramadol at this time.  6. For her dementia will continue her home regimen and donepezil. Will keep family at bedside if possible to reassure patient.  7. Prophylaxis. Will place her on heparin 5000 Glenn.i.d.   8. Disposition: She is in stable condition. Will be admitted to inpatient for further evaluation.   ____________________________ Marisue Ivan, MD kl:cms D: 09/04/2011 19:05:45 ET T: 09/05/2011 05:26:26 ET JOB#: [id number]  cc: Marisue Ivan, MD, <Dictator> Marisue Ivan MD ELECTRONICALLY SIGNED 09/07/2011 7:57

## 2014-07-18 NOTE — Consult Note (Signed)
See consult note.  Likely nausea and vomiting related to UTI, possibly due partly to dementia, dehydration with elevated BUN on admission, can't rule out nausea from withdrawal of some of her meds.  No indication at this time to do EGD unless the problem does not clear in a few days.  Will repeat CXR and abd films.  i will follow with you.  Electronic Signatures: Scot JunElliott, Kavya Haag T (MD)  (Signed on 16-Jun-13 13:50)  Authored  Last Updated: 16-Jun-13 13:50 by Scot JunElliott, Danyele Smejkal T (MD)

## 2014-08-12 ENCOUNTER — Emergency Department
Admission: EM | Admit: 2014-08-12 | Discharge: 2014-08-12 | Disposition: A | Discharge status: Home / Self Care | Payer: Medicare Other | Attending: Emergency Medicine | Admitting: Emergency Medicine

## 2014-08-12 DIAGNOSIS — B029 Zoster without complications: Secondary | ICD-10-CM | POA: Diagnosis not present

## 2014-08-12 DIAGNOSIS — I10 Essential (primary) hypertension: Secondary | ICD-10-CM | POA: Diagnosis not present

## 2014-08-12 DIAGNOSIS — L089 Local infection of the skin and subcutaneous tissue, unspecified: Secondary | ICD-10-CM

## 2014-08-12 HISTORY — DX: Essential (primary) hypertension: I10

## 2014-08-12 HISTORY — DX: Unspecified osteoarthritis, unspecified site: M19.90

## 2014-08-12 MED ORDER — MUPIROCIN 2 % EX OINT: TOPICAL_OINTMENT | Freq: Two times a day (BID) | CUTANEOUS | Status: AC

## 2014-08-12 MED ORDER — BACITRACIN ZINC 500 UNIT/GM EX OINT
TOPICAL_OINTMENT | CUTANEOUS | Status: AC
  Administered 2014-08-12: 1 via TOPICAL
  Filled 2014-08-12: qty 2.7

## 2014-08-12 NOTE — ED Notes (Signed)
Patient diagnosed with shingles 08-06-14 by Dr. Wyatt Mageomey.  Pt lives [facility] and the staff RN advised patient to be brought to ED.  Pt with shingles to right mid back and under right breast.  Shingles to right mid back is draining yellow drainage and has foul smell.

## 2014-08-12 NOTE — ED Provider Notes (Signed)
[facility] Emergency Department Provider Note  ____________________________________________  Time seen: Approximately 2:41 PM   I have reviewed the triage vital signs and the nursing notes.   HISTORY  Chief Complaint Herpes Zoster    HPI Cynthia Glenn is a 81 y.o. female who presents to the emergency department with a seven-day history of shingles. She is a resident at the [facility]. Her family report that over the past 48 hours they have noticed drainage from the area of shingles on the right back. They deny fever.   Past Medical History  Diagnosis Date  . Arthritis   . Hypertension     There are no active problems to display for this patient.   History reviewed. No pertinent past surgical history.  Current Outpatient Rx  Name  Route  Sig  Dispense  Refill  . mupirocin ointment (BACTROBAN) 2 %   Topical   Apply topically 2 (two) times daily. Apply to affected area 3 times daily   22 g   0     Allergies Lisinopril and Statins  History reviewed. No pertinent family history.  Social History History  Substance Use Topics  . Smoking status: Never Smoker   . Smokeless tobacco: Not on file  . Alcohol Use: No    Review of Systems Constitutional: No fever/chills Eyes: No visual changes. ENT: No sore throat. Cardiovascular: Denies chest pain. Respiratory: Denies shortness of breath. Gastrointestinal: No abdominal pain.  No nausea, no vomiting.  No diarrhea.  No constipation. Genitourinary: Negative for dysuria. Musculoskeletal: Negative for back pain. Skin: Rash following thoracic dermatome. Neurological: Negative for headaches, focal weakness or numbness.  10-point ROS otherwise negative.  ____________________________________________   PHYSICAL EXAM:  VITAL SIGNS: ED Triage Vitals  Enc Vitals Group     BP 08/12/14 1319 116/47 mmHg     Pulse Rate 08/12/14 1319 56     Resp --      Temp 08/12/14 1319 97 F (36.1 C)     Temp  Source 08/12/14 1319 Oral     SpO2 08/12/14 1319 95 %     Weight 08/12/14 1319 170 lb (77.111 kg)     Height 08/12/14 1319 5\' 3"  (1.6 m)     Head Cir --      Peak Flow --      Pain Score 08/12/14 1335 9     Pain Loc --      Pain Edu? --      Excl. in GC? --     Constitutional: Alert and oriented. Uncomfortable but in no acute distress. Eyes: Conjunctivae are normal. PERRL. EOMI. Head: Atraumatic. Nose: No congestion/rhinnorhea. Mouth/Throat: Mucous membranes are moist.   Neck: No stridor.   Cardiovascular: Normal rate, regular rhythm.   Good peripheral circulation. Respiratory: Normal respiratory effort.  No retractions. Lungs CTAB. Gastrointestinal: Soft and nontender. No distention. No abdominal bruits. No CVA tenderness. Musculoskeletal: No lower extremity tenderness nor edema.  No joint effusions. Neurologic:  Normal speech and language. No gross focal neurologic deficits are appreciated.  Skin: There is approximately a 10 cm x 4 cm area of skin breakdown in the area of shingles on the right posterior thorax. Yellow drainage present on the dressing. There is also an area of skin breakdown on the right anterior thorax, however no drainage noted. The surrounding skin does not appear to be cellulitic. There is no fluctuance or induration. Psychiatric: Mood and affect are normal. Speech and behavior are normal.  ____________________________________________   LABS (  all labs ordered are listed, but only abnormal results are displayed)  Labs Reviewed - No data to display ____________________________________________  EKG   ____________________________________________  RADIOLOGY  Not indicated ____________________________________________   PROCEDURES  Procedure(s) performed: Dressings changed by me. Bacitracin ointment and Vaseline gauze applied under ABD pad.  Critical Care performed:   ____________________________________________   INITIAL IMPRESSION / ASSESSMENT  AND PLAN / ED COURSE  Pertinent labs & imaging results that were available during my care of the patient were reviewed by me and considered in my medical decision making (see chart for details).  Patient [facility]. She will receive a prescription for Bactroban ointment which is to be applied twice a day with a dressing change. She will need follow-up in 2 days to make sure there is no evidence of worsening infection. Family aware of and agree with the plan. Return precautions given. ____________________________________________   FINAL CLINICAL IMPRESSION(S) / ED DIAGNOSES  Final diagnoses:  Herpes zoster infection of thoracic region  Skin infection      Chinita PesterCari B Donasia Wimes, FNP 08/12/14 1619  Myrna Blazeravid Matthew Schaevitz, MD 08/12/14 2126

## 2014-09-06 ENCOUNTER — Emergency Department: Payer: Medicare Other

## 2014-09-06 ENCOUNTER — Encounter: Payer: Self-pay | Admitting: *Deleted

## 2014-09-06 ENCOUNTER — Emergency Department
Admission: EM | Admit: 2014-09-06 | Discharge: 2014-09-06 | Disposition: A | Discharge status: Home / Self Care | Payer: Medicare Other | Attending: Emergency Medicine | Admitting: Emergency Medicine

## 2014-09-06 ENCOUNTER — Other Ambulatory Visit: Payer: Self-pay

## 2014-09-06 DIAGNOSIS — W1839XA Other fall on same level, initial encounter: Secondary | ICD-10-CM | POA: Insufficient documentation

## 2014-09-06 DIAGNOSIS — I10 Essential (primary) hypertension: Secondary | ICD-10-CM | POA: Insufficient documentation

## 2014-09-06 DIAGNOSIS — S3992XA Unspecified injury of lower back, initial encounter: Secondary | ICD-10-CM | POA: Diagnosis present

## 2014-09-06 DIAGNOSIS — Y92129 Unspecified place in nursing home as the place of occurrence of the external cause: Secondary | ICD-10-CM | POA: Diagnosis not present

## 2014-09-06 DIAGNOSIS — Y998 Other external cause status: Secondary | ICD-10-CM | POA: Diagnosis not present

## 2014-09-06 DIAGNOSIS — N390 Urinary tract infection, site not specified: Secondary | ICD-10-CM | POA: Diagnosis not present

## 2014-09-06 DIAGNOSIS — F0390 Unspecified dementia without behavioral disturbance: Secondary | ICD-10-CM | POA: Insufficient documentation

## 2014-09-06 DIAGNOSIS — Y9389 Activity, other specified: Secondary | ICD-10-CM | POA: Insufficient documentation

## 2014-09-06 DIAGNOSIS — W19XXXA Unspecified fall, initial encounter: Secondary | ICD-10-CM

## 2014-09-06 DIAGNOSIS — S20221A Contusion of right back wall of thorax, initial encounter: Secondary | ICD-10-CM | POA: Insufficient documentation

## 2014-09-06 HISTORY — DX: Chronic obstructive pulmonary disease, unspecified: J44.9

## 2014-09-06 HISTORY — DX: Zoster without complications: B02.9

## 2014-09-06 HISTORY — DX: Unspecified dementia, unspecified severity, without behavioral disturbance, psychotic disturbance, mood disturbance, and anxiety: F03.90

## 2014-09-06 LAB — URINALYSIS COMPLETE WITH MICROSCOPIC (ARMC ONLY)
Bilirubin Urine: NEGATIVE
GLUCOSE, UA: NEGATIVE mg/dL
Hgb urine dipstick: NEGATIVE
Leukocytes, UA: NEGATIVE
Nitrite: POSITIVE — AB
Protein, ur: 100 mg/dL — AB
Specific Gravity, Urine: 1.018 (ref 1.005–1.030)
pH: 7 (ref 5.0–8.0)

## 2014-09-06 LAB — GLUCOSE, CAPILLARY: Glucose-Capillary: 87 mg/dL (ref 65–99)

## 2014-09-06 MED ORDER — CEPHALEXIN 500 MG PO CAPS: 500.0000 mg | ORAL_CAPSULE | Freq: Four times a day (QID) | ORAL | Status: DC

## 2014-09-06 MED ORDER — CEPHALEXIN 500 MG PO CAPS
ORAL_CAPSULE | ORAL | Status: AC
  Administered 2014-09-06: 500 mg via ORAL
  Filled 2014-09-06: qty 1

## 2014-09-06 MED ORDER — ACETAMINOPHEN 500 MG PO TABS
1000.0000 mg | ORAL_TABLET | ORAL | Status: AC
  Administered 2014-09-06: 1000 mg via ORAL

## 2014-09-06 MED ORDER — ACETAMINOPHEN 500 MG PO TABS
ORAL_TABLET | ORAL | Status: AC
  Administered 2014-09-06: 1000 mg via ORAL
  Filled 2014-09-06: qty 2

## 2014-09-06 MED ORDER — CEPHALEXIN 500 MG PO CAPS
500.0000 mg | ORAL_CAPSULE | ORAL | Status: AC
  Administered 2014-09-06: 500 mg via ORAL

## 2014-09-06 NOTE — ED Notes (Signed)
According to Shanda Bumps at nursing home, pts daughter on way to hospital and willing to transport, attempted to call daughter with no answer, awaiting daughter

## 2014-09-06 NOTE — Discharge Instructions (Signed)
Please follow-up with your nursing facility's physician tomorrow for reevaluation.  It appears you have a urinary tract infection and we have started you on a week of cephalexin for treatment. He should come to the ER if you have a low blood pressure, develop a fever, have worsening symptoms like nausea or vomiting, or other new concerns arise.  It appears that you have bruised here right lower back, but we do not see evidence of a fracture. Return to the ER right away should you be unable to walk, have severe leg pain, weakness, cold or blue foot, or other new concerns arise.  Fall Prevention and Home Safety Falls cause injuries and can affect all age groups. It is possible to prevent falls.  HOW TO PREVENT FALLS  Wear shoes with rubber soles that do not have an opening for your toes.  Keep the inside and outside of your house well lit.  Use night lights throughout your home.  Remove clutter from floors.  Clean up floor spills.  Remove throw rugs or fasten them to the floor with carpet tape.  Do not place electrical cords across pathways.  Put grab bars by your tub, shower, and toilet. Do not use towel bars as grab bars.  Put handrails on both sides of the stairway. Fix loose handrails.  Do not climb on stools or stepladders, if possible.  Do not wax your floors.  Repair uneven or unsafe sidewalks, walkways, or stairs.  Keep items you use a lot within reach.  Be aware of pets.  Keep emergency numbers next to the telephone.  Put smoke detectors in your home and near bedrooms. Ask your doctor what other things you can do to prevent falls. Document Released: 01/06/2009 Document Revised: 09/11/2011 Document Reviewed: 06/12/2011 [facility] Patient Information 2015 [place], [place]. This information is not intended to replace advice given to you by your health care provider. Make sure you discuss any questions you have with your health care provider.  Urinary Tract Infection A  urinary tract infection (UTI) can occur any place along the urinary tract. The tract includes the kidneys, ureters, bladder, and urethra. A type of germ called bacteria often causes a UTI. UTIs are often helped with antibiotic medicine.  HOME CARE   If given, take antibiotics as told by your doctor. Finish them even if you start to feel better.  Drink enough fluids to keep your pee (urine) clear or pale yellow.  Avoid tea, drinks with caffeine, and bubbly (carbonated) drinks.  Pee often. Avoid holding your pee in for a long time.  Pee before and after having sex (intercourse).  Wipe from front to back after you poop (bowel movement) if you are a woman. Use each tissue only once. GET HELP RIGHT AWAY IF:   You have back pain.  You have lower belly (abdominal) pain.  You have chills.  You feel sick to your stomach (nauseous).  You throw up (vomit).  Your burning or discomfort with peeing does not go away.  You have a fever.  Your symptoms are not better in 3 days. MAKE SURE YOU:   Understand these instructions.  Will watch your condition.  Will get help right away if you are not doing well or get worse. Document Released: 08/29/2007 Document Revised: 12/05/2011 Document Reviewed: 10/11/2011 [facility] Patient Information 2015 [place], [place]. This information is not intended to replace advice given to you by your health care provider. Make sure you discuss any questions you have with your health care  provider.

## 2014-09-06 NOTE — ED Notes (Signed)
Pt arrives via EMS from the [facility], pt had an unwitnessed fall, roommate found her in floor, pt has hx of alzhemiers dementia, pt states pain in right hip area, pt states she does not know what happened

## 2014-09-06 NOTE — ED Provider Notes (Signed)
[facility] Emergency Department Provider Note  ____________________________________________  Time seen: Approximately 4:23 PM  I have reviewed the triage vital signs and the nursing notes.  EM caveat. Patient has dementia and is unable to recall falling or events from today.  HISTORY  Chief Complaint Fall    HPI Cynthia Glenn is a 81 y.o. female who had an unwitnessed fall at her nursing home.  Per the patient, she does have a slight achiness in her right lower back but otherwise no pain. She does not wish for any medicine. She is able to state her name, and is very pleasant, but the patient does not recall any events from today and is unable to provide further history.  She does deny pain in her hip or the right leg. She denies pain in her left leg. There is no pain in her head, headache, neck pain, or other concerns from the patient. She does have slight achiness over the right lower back area   Past Medical History  Diagnosis Date  . Arthritis   . Hypertension   . Dementia   . COPD (chronic obstructive pulmonary disease)   . Shingles     There are no active problems to display for this patient.   History reviewed. No pertinent past surgical history.  Current Outpatient Rx  Name  Route  Sig  Dispense  Refill  . cephALEXin (KEFLEX) 500 MG capsule   Oral   Take 1 capsule (500 mg total) by mouth 4 (four) times daily.   40 capsule   0     Allergies Lisinopril; Statins; and Ciprofloxacin  History reviewed. No pertinent family history.  Social History History  Substance Use Topics  . Smoking status: Never Smoker   . Smokeless tobacco: Not on file  . Alcohol Use: No    Review of Systems  Again, review of systems may not be entirely reliable because of the patient's dementia.  Constitutional: No fever/chills Eyes: No visual changes. Cardiovascular: Denies chest pain. Respiratory: Denies shortness of breath. Gastrointestinal:  No abdominal pain.  No nausea, no vomiting.  No diarrhea.  No constipation. Genitourinary: Negative for dysuria. Musculoskeletal: Aching pain over the right lower back. Skin: Patient has had a slight rash on her right back Neurological: Negative for headaches, focal weakness or numbness.  10-point ROS otherwise negative.  ____________________________________________   PHYSICAL EXAM:  VITAL SIGNS: ED Triage Vitals  Enc Vitals Group     BP 09/06/14 1554 139/47 mmHg     Pulse Rate 09/06/14 1554 54     Resp 09/06/14 1554 14     Temp 09/06/14 1554 98.6 F (37 C)     Temp Source 09/06/14 1554 Oral     SpO2 09/06/14 1554 96 %     Weight 09/06/14 1554 168 lb (76.204 kg)     Height 09/06/14 1554 5\' 5"  (1.651 m)     Head Cir --      Peak Flow --      Pain Score --      Pain Loc --      Pain Edu? --      Excl. in GC? --     Constitutional: Alert and oriented but only to self. Well appearing and in no acute distress. Patient does not recall events from today, she does not know the year or where she is presently other than she thinks she is at a hospital. Eyes: Conjunctivae are normal. PERRL. EOMI. Head: Atraumatic. Nose: No  congestion/rhinnorhea. Mouth/Throat: Mucous membranes are moist.  Oropharynx non-erythematous. Neck: No stridor.  No cervical spine tenderness Cardiovascular: Normal rate, regular rhythm. Grossly normal heart sounds.  Good peripheral circulation. Respiratory: Normal respiratory effort.  No retractions. Lungs CTAB. Gastrointestinal: Soft and nontender. No distention. No abdominal bruits. No CVA tenderness. Musculoskeletal: No lower extremity tenderness nor edema.  No joint effusions. The patient does have some tenderness over the right lower back, this seems to correlate with an area of scaling rash that appears consistent with healing shingles. There is no deformity.  The patient demonstrates good use of her right leg. She has full range of motion of the hips,  knees, and ankles bilaterally. There is no shortening or rotation. Positive dorsalis pedis pulses in lower extremities bilaterally.  Neurologic:  Normal speech and language except they are soft and slow. No gross focal neurologic deficits are appreciated.  Skin:  Skin is warm, dry and intact. No rash noted. Psychiatric: Mood and affect are normal. Speech and behavior are normal.  ____________________________________________   LABS (all labs ordered are listed, but only abnormal results are displayed)  Labs Reviewed  URINALYSIS COMPLETEWITH MICROSCOPIC ([facility] ONLY) - Abnormal; Notable for the following:    Color, Urine YELLOW (*)    APPearance CLOUDY (*)    Ketones, ur TRACE (*)    Protein, ur 100 (*)    Nitrite POSITIVE (*)    Bacteria, UA MANY (*)    Squamous Epithelial / LPF 0-5 (*)    All other components within normal limits  GLUCOSE, CAPILLARY   ____________________________________________  EKG  ED ECG REPORT I, Kelyse Pask, the attending physician, personally viewed and interpreted this ECG.   Date: 09/06/2014  EKG Time: 1600  Rate: 55  Rhythm: unchanged from previous tracings, sinus bradycardia  Axis: Left axis  Intervals:first-degree A-V block   ST&T Change: T wave inversions in lateral leads, this appears to be chronic compared to previous from December 2014.  ____________________________________________  RADIOLOGY  DG FEMUR, MIN 2 VIEWS RIGHT (Final result) Result time: 09/06/14 17:04:06   Final result by Rad Results In Interface (09/06/14 17:04:06)   Narrative:   CLINICAL DATA: Unwitnessed fall, found on floor, history Alzheimer's, RIGHT hip pain  EXAM: RIGHT FEMUR 2 VIEWS  COMPARISON: None  FINDINGS: Osseous demineralization.  Joint space narrowing RIGHT knee, minimally RIGHT hip.  No acute fracture, dislocation, or bone destruction.  Extensive atherosclerotic calcifications.  IMPRESSION: No acute bony abnormalities.  Degenerative  changes RIGHT knee and minimally at RIGHT hip.   Electronically Signed By: Ulyses Southward M.D. On: 09/06/2014 17:04          DG Pelvis 1-2 Views (Final result) Result time: 09/06/14 17:05:28   Final result by Rad Results In Interface (09/06/14 17:05:28)   Narrative:   CLINICAL DATA: Unwitnessed fall, found on floor, history Alzheimer's, RIGHT hip pain  EXAM: PELVIS - 1-2 VIEW  COMPARISON: None  FINDINGS: Osseous demineralization.  Degenerative changes at visualized lower lumbar spine.  Minimal narrowing of the RIGHT hip joint versus LEFT.  SI joints symmetric.  Extensive atherosclerotic calcifications.  No definite acute fracture, dislocation, or bone destruction.  IMPRESSION: No definite acute bony abnormalities.   Electronically Signed By: Ulyses Southward M.D. On: 09/06/2014 17:05          DG Lumbar Spine 2-3 Views (Final result) Result time: 09/06/14 17:10:43   Final result by Rad Results In Interface (09/06/14 17:10:43)   Narrative:   CLINICAL DATA: Unwitnessed fall. Alzheimer disease. Shingles.  EXAM:  LUMBAR SPINE - 2-3 VIEW  COMPARISON: 12/11/2012  FINDINGS: Bony demineralization. Numerous clips in the anatomic pelvis. Mild dextroconvex lumbar scoliosis. Splenic artery atherosclerosis with a 1.4 cm splenic artery aneurysm.  Aortoiliac atherosclerotic vascular disease. T12 compression fracture, old. Multilevel Schmorl's nodes. Loss of disc height at L4-5. I do not observe an acute lumbar spine fracture.  Grade 1 degenerative anterolisthesis at L3-4, stable from prior. Multilevel facet arthropathy.  Prominent stool throughout the colon favors constipation.  IMPRESSION: 1. Lumbar spondylosis and degenerative disc disease, without compelling findings of acute fracture or acute subluxation. 2. Remote compression fracture T12. 3. Bony demineralization. 4. Aortoiliac atherosclerotic vascular disease. 5. 1.4 cm splenic artery  aneurysm. 6. Dextroconvex lumbar scoliosis.    ____________________________________________   PROCEDURES  Procedure(s) performed: None  Critical Care performed: No  ____________________________________________   INITIAL IMPRESSION / ASSESSMENT AND PLAN / ED COURSE  Pertinent labs & imaging results that were available during my care of the patient were reviewed by me and considered in my medical decision making (see chart for details).  Patient with unwitnessed fall. On exam she has no evidence of overt trauma, except for some mild tenderness over the right lower back that is probably correlating to an area of shingles which is improving based on previous history. She demonstrates no evidence of acute hip fracture or deformity. Because of her low back pain, age, and dementia we will obtain x-rays of the right femur and pelvis. In addition, the patient has no evidence of head injury. No bruising, no ecchymosis, denies headache. The present time there is nothing to indicate a major mechanism of fall. Do not think the patient requires CT head at this point. Patient does not have any anticoagulative listed on her medication list. No evidence of neck fracture. No chest pain or cardiopulmonary symptoms. Vital signs are stable. ____________________________________________  ----------------------------------------- 5:52 PM on 09/06/2014 -----------------------------------------  Patient remained stable in no distress. She is alert. Labs are notable for a urinary tract infection which we will treat with cephalexin based on recommendations from infectious disease at [facility].  The patient has no evidence of acute fracture or major injury from her fall today. She has no pain with axial loading of her either hip, good range of motion, and I do not believe she has any symptoms that warrant eating CT.  She is under the care of a physician at nursing facility. We will discharge her to home with  recommendation for the nursing facility's physician to follow up and reevaluate the patient tomorrow.     FINAL CLINICAL IMPRESSION(S) / ED DIAGNOSES  Final diagnoses:  Fall  Acute urinary tract infection  Back contusion, right, initial encounter      Sharyn Creamer, MD 09/06/14 1753

## 2014-09-08 ENCOUNTER — Emergency Department: Payer: Medicare Other

## 2014-09-08 ENCOUNTER — Emergency Department
Admission: EM | Admit: 2014-09-08 | Discharge: 2014-09-08 | Disposition: A | Discharge status: Home / Self Care | Payer: Medicare Other | Attending: Student | Admitting: Student

## 2014-09-08 ENCOUNTER — Encounter: Payer: Self-pay | Admitting: Emergency Medicine

## 2014-09-08 DIAGNOSIS — Y9389 Activity, other specified: Secondary | ICD-10-CM | POA: Insufficient documentation

## 2014-09-08 DIAGNOSIS — Z792 Long term (current) use of antibiotics: Secondary | ICD-10-CM | POA: Insufficient documentation

## 2014-09-08 DIAGNOSIS — W19XXXA Unspecified fall, initial encounter: Secondary | ICD-10-CM

## 2014-09-08 DIAGNOSIS — S0990XA Unspecified injury of head, initial encounter: Secondary | ICD-10-CM | POA: Insufficient documentation

## 2014-09-08 DIAGNOSIS — W06XXXA Fall from bed, initial encounter: Secondary | ICD-10-CM | POA: Insufficient documentation

## 2014-09-08 DIAGNOSIS — Y998 Other external cause status: Secondary | ICD-10-CM | POA: Diagnosis not present

## 2014-09-08 DIAGNOSIS — S299XXA Unspecified injury of thorax, initial encounter: Secondary | ICD-10-CM | POA: Diagnosis present

## 2014-09-08 DIAGNOSIS — I10 Essential (primary) hypertension: Secondary | ICD-10-CM | POA: Insufficient documentation

## 2014-09-08 DIAGNOSIS — F0390 Unspecified dementia without behavioral disturbance: Secondary | ICD-10-CM | POA: Diagnosis not present

## 2014-09-08 DIAGNOSIS — Y92129 Unspecified place in nursing home as the place of occurrence of the external cause: Secondary | ICD-10-CM | POA: Insufficient documentation

## 2014-09-08 MED ORDER — ACETAMINOPHEN 500 MG PO TABS
1000.0000 mg | ORAL_TABLET | Freq: Once | ORAL | Status: AC
  Administered 2014-09-08: 1000 mg via ORAL

## 2014-09-08 MED ORDER — ACETAMINOPHEN 500 MG PO TABS
ORAL_TABLET | ORAL | Status: AC
  Administered 2014-09-08: 1000 mg via ORAL
  Filled 2014-09-08: qty 2

## 2014-09-08 NOTE — ED Provider Notes (Signed)
[facility] Emergency Department Provider Note  ____________________________________________  Time seen: Approximately 12:22 PM  I have reviewed the triage vital signs and the nursing notes.   HISTORY  Chief Complaint Fall  Caveat-history of present illness and review of systems limited secondary to the patient's dementia. All history of present illness/review of systems obtained from EMS, family at bedside, staff at the [facility].  HPI Cynthia Glenn is a 81 y.o. female with history of dementia, hypertension, COPD, GERD, depression presents for evaluation of fall at her living facility, the [place]. According to the [facility] staff, the patient was to continue out of bed and fell onto her right side. She is complaining of right chest pain. The patient reports that she did hit her head however there was no loss of consciousness. She denies any neck pain. She is also complaining of back pain. Daughter at bedside reports that she was recently diagnosed with a urinary tract infection when seen here 2 days ago. She's had no fevers, vomiting, diarrhea.   Past Medical History  Diagnosis Date  . Arthritis   . Hypertension   . Dementia   . COPD (chronic obstructive pulmonary disease)   . Shingles     There are no active problems to display for this patient.   History reviewed. No pertinent past surgical history.  Current Outpatient Rx  Name  Route  Sig  Dispense  Refill  . cephALEXin (KEFLEX) 500 MG capsule   Oral   Take 1 capsule (500 mg total) by mouth 4 (four) times daily.   40 capsule   0     Allergies Lisinopril; Statins; and Ciprofloxacin  History reviewed. No pertinent family history.  Social History History  Substance Use Topics  . Smoking status: Never Smoker   . Smokeless tobacco: Not on file  . Alcohol Use: No    Review of Systems   Caveat-history of present illness and review of systems limited secondary to the patient's dementia. All history  of present illness/review of systems obtained from EMS, family at bedside, staff at the [facility]. ____________________________________________   PHYSICAL EXAM:  VITAL SIGNS: ED Triage Vitals  Enc Vitals Group     BP 09/08/14 1113 154/54 mmHg     Pulse Rate 09/08/14 1113 60     Resp 09/08/14 1113 18     Temp 09/08/14 1113 97.9 F (36.6 C)     Temp Source 09/08/14 1113 Tympanic     SpO2 09/08/14 1108 99 %     Weight 09/08/14 1113 160 lb (72.576 kg)     Height 09/08/14 1113 5\' 7"  (1.702 m)     Head Cir --      Peak Flow --      Pain Score --      Pain Loc --      Pain Edu? --      Excl. in GC? --     Constitutional: Alert and oriented to self and place which is her baseline. Well appearing and in no acute distress. Eyes: Conjunctivae are normal. PERRL. EOMI. Head: Atraumatic. Nose: No congestion/rhinnorhea. Mouth/Throat: Mucous membranes are moist.  Oropharynx non-erythematous. Neck: No stridor. No cervical spine tenderness to palpation. Cardiovascular: Normal rate, regular rhythm. Grossly normal heart sounds.  Good peripheral circulation. Respiratory: Normal respiratory effort.  No retractions. Lungs CTAB. Gastrointestinal: Soft and nontender. No distention. No abdominal bruits. No CVA tenderness. Genitourinary: deferred Musculoskeletal: No lower extremity tenderness nor edema.  No joint effusions. Chest wall tenderness to  palpation throughout the right anterolateral chest. Pelvis is stable to rock and compression. Full painless range of motion bilateral hips. Mid line tenderness to palpation throughout the T or L-spine Neurologic:  Normal speech and language. No gross focal neurologic deficits are appreciated. Speech is normal. Skin:  Skin is warm, dry and intact. No rash noted. Psychiatric: Mood and affect are normal. Speech and behavior are normal.  ____________________________________________   LABS (all labs ordered are listed, but only abnormal results are  displayed)  Labs Reviewed - No data to display ____________________________________________  EKG  none ____________________________________________  RADIOLOGY  CXR/right rib xray IMPRESSION: No active cardiopulmonary disease. IMPRESSION: No acute or traumatic findings.  CT head/c-spine CT HEAD FINDINGS  There is significant central and cortical atrophy. Periventricular white matter changes are consistent with small vessel disease. There is no evidence for hemorrhage, mass lesion, or acute infarction. Bone windows show no calvarial fracture. There is dense atherosclerotic calcification of the internal carotid arteries.  CT CERVICAL SPINE FINDINGS  There is moderate mid cervical degenerative change. Loss of lordosis and reversal of mid cervical lordosis is noted, likely degenerative. There is mild anterolisthesis of C3 on C4 with which measures 2 mm. There is 2 mm of anterolisthesis of C4 on C5. No evidence for acute fracture or subluxation. Lung apices are clear. Note is made of a prominent thyroid gland, which appears somewhat nodular. Prominent left lobe is noted. No suspicious thyroid lesions are identified.  IMPRESSION: 1. No evidence for acute intracranial abnormality. 2. Atrophy and periventricular white matter changes. 3. Mid cervical degenerative changes. 4. No acute cervical spine fracture. Loss of lordosis is likely degenerative.    Xray thoracic IMPRESSION: No acute osseous injury of the thoracic spine. Remote T12 vertebral body compression fracture.  Xray lumbar IMPRESSION: 1. No acute findings. 2. Remote T12 compression fracture. ____________________________________________   PROCEDURES  Procedure(s) performed: None  Critical Care performed: No  ____________________________________________   INITIAL IMPRESSION / ASSESSMENT AND PLAN / ED COURSE  Pertinent labs & imaging results that were available during my care of the patient were  reviewed by me and considered in my medical decision making (see chart for details).  Cynthia Glenn is a 81 y.o. female with history of dementia, hypertension, COPD, GERD, depression presents for evaluation of fall at her living facility, the [place]. On exam, the patient is well-appearing, winces in pain intermittently with movement but otherwise is pleasant. Vital signs stable, she is afebrile. She does have tenderness to palpation throughout the right anterior chest wall and his complaint of back pain. She reported that she had her head so we'll obtain CT head and C-spine, plain films of the chest/ribs, thoracic and lumbar spine. We'll treat her pain.  ----------------------------------------- 2:53 PM on 09/08/2014 -----------------------------------------  Imaging negative for any acute medical pathology. Pain improved. Discussed return precautions with family/daughters at bedside. They will take her back to the [place] she will follow-up with her primary care doctor. They're comfortable with the discharge plan. ____________________________________________   FINAL CLINICAL IMPRESSION(S) / ED DIAGNOSES  Final diagnoses:  Fall, initial encounter      Gayla Doss, MD 09/08/14 1454

## 2014-09-08 NOTE — ED Notes (Signed)
Pt from the [facility]. Staff informed EMS that pt was getting out of bed and fell hitting her right side. Pt has Dementia. She is only able to say that her back hurts. Pt winces with any movement.

## 2014-09-08 NOTE — ED Notes (Signed)
Report given to the [facility]

## 2014-09-08 NOTE — Discharge Instructions (Signed)
For pain, you can give Cynthia Glenn Tylenol according to package instructions. She should see her primary care doctor within the next 2-3 days for reevaluation. She should return immediately to the emergency department if she develops severe or worsening pain, difficulty breathing, numbness, weakness, severe headache, change in her mental status, fevers, vomiting, or for any other concerns.

## 2014-09-26 ENCOUNTER — Emergency Department
Admission: EM | Admit: 2014-09-26 | Discharge: 2014-09-26 | Disposition: A | Discharge status: Home / Self Care | Payer: Medicare Other | Attending: Emergency Medicine | Admitting: Emergency Medicine

## 2014-09-26 ENCOUNTER — Encounter: Payer: Self-pay | Admitting: Emergency Medicine

## 2014-09-26 ENCOUNTER — Emergency Department: Payer: Medicare Other

## 2014-09-26 DIAGNOSIS — Z792 Long term (current) use of antibiotics: Secondary | ICD-10-CM | POA: Insufficient documentation

## 2014-09-26 DIAGNOSIS — S20229D Contusion of unspecified back wall of thorax, subsequent encounter: Secondary | ICD-10-CM | POA: Diagnosis not present

## 2014-09-26 DIAGNOSIS — W01198D Fall on same level from slipping, tripping and stumbling with subsequent striking against other object, subsequent encounter: Secondary | ICD-10-CM | POA: Insufficient documentation

## 2014-09-26 DIAGNOSIS — I10 Essential (primary) hypertension: Secondary | ICD-10-CM | POA: Insufficient documentation

## 2014-09-26 DIAGNOSIS — F0390 Unspecified dementia without behavioral disturbance: Secondary | ICD-10-CM | POA: Diagnosis not present

## 2014-09-26 DIAGNOSIS — S301XXD Contusion of abdominal wall, subsequent encounter: Secondary | ICD-10-CM | POA: Diagnosis not present

## 2014-09-26 DIAGNOSIS — W19XXXD Unspecified fall, subsequent encounter: Secondary | ICD-10-CM

## 2014-09-26 DIAGNOSIS — Z043 Encounter for examination and observation following other accident: Secondary | ICD-10-CM | POA: Diagnosis present

## 2014-09-26 NOTE — ED Notes (Signed)
Pt presents to ER alert and in NAD. Pt arrives to ER via EMS from [facility]. Pt had unwitnessed fall from w/c. Pt has small bruising noted to right flank.

## 2014-09-26 NOTE — ED Notes (Signed)
Pt has Fentanyl patch on left lower back, left in place.

## 2014-09-26 NOTE — ED Notes (Signed)
Patient resting in stretcher with eyes closed. Respirations even and unlabored. No obvious distress.No needs/concerns verbalized at this time. Family at bedside. Call bell within reach. Will continue to monitor.

## 2014-09-26 NOTE — ED Provider Notes (Addendum)
[facility] Emergency Department Provider Note  ____________________________________________  Time seen: Approximately 710 AM  I have reviewed the triage vital signs and the nursing notes.   HISTORY  Chief Complaint Fall    HPI Cynthia Glenn is a 81 y.o. female with a history of dementia and multiple falls who had a fall this morning at her nursing home. The fall wasn't witnessed. ER nurse took sign out that the patient was being transferred from her wheelchair to her bedthis morning. The staff stepped out of the room and the patient fell, hitting her right side. The patient is unable to give a detailed history because of her dementia. She is oriented to herself as well as that she is in a hospital but does not know what year it is or her birthday. She denies any complaints at this time. Not complaining of chest pain, nausea, vomiting, diarrhea, shortness of breath.   Past Medical History  Diagnosis Date  . Arthritis   . Hypertension   . Dementia   . COPD (chronic obstructive pulmonary disease)   . Shingles     There are no active problems to display for this patient.   History reviewed. No pertinent past surgical history.  Current Outpatient Rx  Name  Route  Sig  Dispense  Refill  . cephALEXin (KEFLEX) 500 MG capsule   Oral   Take 1 capsule (500 mg total) by mouth 4 (four) times daily.   40 capsule   0     Allergies Lisinopril; Statins; and Ciprofloxacin  History reviewed. No pertinent family history.  Social History History  Substance Use Topics  . Smoking status: Never Smoker   . Smokeless tobacco: Not on file  . Alcohol Use: No    Review of Systems Constitutional: No fever/chills Eyes: No visual changes. ENT: No sore throat. Cardiovascular: Denies chest pain. Respiratory: Denies shortness of breath. Gastrointestinal: No abdominal pain.  No nausea, no vomiting.  No diarrhea.  No constipation. Genitourinary: Negative for  dysuria. Musculoskeletal: Negative for back pain. Skin: Negative for rash. Neurological: Negative for headaches, focal weakness or numbness.  10-point ROS otherwise negative. However, history is confounded by the patient's dementia.  ____________________________________________   PHYSICAL EXAM:  VITAL SIGNS: ED Triage Vitals  Enc Vitals Group     BP 09/26/14 0641 174/58 mmHg     Pulse Rate 09/26/14 0641 62     Resp 09/26/14 0641 18     Temp 09/26/14 0641 98 F (36.7 C)     Temp Source 09/26/14 0641 Oral     SpO2 09/26/14 0641 99 %     Weight 09/26/14 0641 165 lb (74.844 kg)     Height --      Head Cir --      Peak Flow --      Pain Score --      Pain Loc --      Pain Edu? --      Excl. in GC? --     Constitutional: Alert and oriented. Well appearing and in no acute distress. Eyes: Conjunctivae are normal. PERRL. EOMI. Head: Atraumatic. Nose: No congestion/rhinnorhea. Mouth/Throat: Mucous membranes are moist.  Oropharynx non-erythematous. Neck: No stridor.   Cardiovascular: Normal rate, regular rhythm. Grossly normal heart sounds.  Good peripheral circulation. Respiratory: Normal respiratory effort.  No retractions. Lungs CTAB. Gastrointestinal: Soft and nontender. No distention. No abdominal bruits. CVA tenderness to the right where there is a large area of ecchymosis to the right flank  and back. Appears older as it is purple. Musculoskeletal: No lower extremity tenderness nor edema.  No joint effusions. Neurologic:  Normal speech and language. No gross focal neurologic deficits are appreciated. Speech is normal. No gait instability. Skin:  Skin is warm, dry and intact. No rash noted. Psychiatric: Mood and affect are normal. Speech and behavior are normal.  ____________________________________________   LABS (all labs ordered are listed, but only abnormal results are displayed)  Labs Reviewed - No data to  display ____________________________________________  EKG   ____________________________________________  RADIOLOGY  No acute bony abnormality on the pelvis x-ray. I personally reviewed these films. ____________________________________________   PROCEDURES    ____________________________________________   INITIAL IMPRESSION / ASSESSMENT AND PLAN / ED COURSE  Pertinent labs & imaging results that were available during my care of the patient were reviewed by me and considered in my medical decision making (see chart for details).  ----------------------------------------- 7:29 AM on 09/26/2014 -----------------------------------------  Discussed the case with patient's son, Cynthia Glenn.  I informed him that his mother was in the emergency department and he to fall. He says that his mother has had multiple falls recently including one 2 days prior. We agreed that we would not do a full medical workup to rule out syncope. He says that from my description the patient sounds that she is at her baseline mental status. We will do CAT scans to make sure there are no internal injuries. However, we discussed invasive testing such as blood work or urine catheterization which the family feels is unnecessary at this time.  ----------------------------------------- 9:30 AM on 09/26/2014 -----------------------------------------  Patient resting comfortably at this time and daughter at bedside. I discussed the case with the daughter who says that the patient has had multiple falls. Says that she was not notified at the time of the fall but if she had been she likely would've not recommended the patient be transported to the hospital. Says that the ecchymosis on her back is from shingles from over a month ago that has been slowly healing. Examined the patient with the daughter and no obvious new bruising identified. Given this new information we discussed her workup and agreed that we would do  a pelvic x-ray but would forego any further testing. The pelvic x-ray revealed no acute bony abnormality and the patient was able to ambulate with assistance to the toilet to urinate. The patient will be discharged back to her skilled nursing facility. The daughter says that she will transport her. Discussed the x-ray findings and plan with the daughter. ____________________________________________   FINAL CLINICAL IMPRESSION(S) / ED DIAGNOSES  Acute fall. Return visit.    Myrna Blazer, MD 09/26/14 0932  Unclear cause of fall. However, patient with multiple mechanical falls. Further workup discussed with family and do not one further medical workup at this time.   Myrna Blazer, MD 09/26/14 [phone]

## 2014-09-26 NOTE — ED Notes (Signed)
[facility] Patient voided. Patent back in bed resting in stretcher. No obvious distress. Family at bedside. Call bell within reach. Will continue to monitor.

## 2014-09-26 NOTE — ED Notes (Addendum)
Discussed plan of care with patient's daughter. Patient's daughter states that the darkened areas on patient's ribs and flanks are actually from her recent episode of the shingles. Informed ED MD.

## 2014-09-26 NOTE — ED Notes (Addendum)
D/c instructions reviewed w/ pt's daughter who denies any further questions or concerns at present.

## 2014-11-22 ENCOUNTER — Emergency Department
Admission: EM | Admit: 2014-11-22 | Discharge: 2014-11-22 | Disposition: A | Discharge status: Home / Self Care | Payer: Medicare Other | Attending: Emergency Medicine | Admitting: Emergency Medicine

## 2014-11-22 ENCOUNTER — Emergency Department: Payer: Medicare Other

## 2014-11-22 ENCOUNTER — Encounter: Payer: Self-pay | Admitting: Emergency Medicine

## 2014-11-22 DIAGNOSIS — S7011XA Contusion of right thigh, initial encounter: Secondary | ICD-10-CM | POA: Diagnosis not present

## 2014-11-22 DIAGNOSIS — S7001XA Contusion of right hip, initial encounter: Secondary | ICD-10-CM

## 2014-11-22 DIAGNOSIS — I10 Essential (primary) hypertension: Secondary | ICD-10-CM | POA: Diagnosis not present

## 2014-11-22 DIAGNOSIS — S79911A Unspecified injury of right hip, initial encounter: Secondary | ICD-10-CM | POA: Diagnosis present

## 2014-11-22 DIAGNOSIS — W06XXXA Fall from bed, initial encounter: Secondary | ICD-10-CM | POA: Insufficient documentation

## 2014-11-22 DIAGNOSIS — Y9389 Activity, other specified: Secondary | ICD-10-CM | POA: Insufficient documentation

## 2014-11-22 DIAGNOSIS — Y92193 Bedroom in other specified residential institution as the place of occurrence of the external cause: Secondary | ICD-10-CM | POA: Insufficient documentation

## 2014-11-22 DIAGNOSIS — Y998 Other external cause status: Secondary | ICD-10-CM | POA: Insufficient documentation

## 2014-11-22 DIAGNOSIS — N390 Urinary tract infection, site not specified: Secondary | ICD-10-CM | POA: Diagnosis not present

## 2014-11-22 LAB — URINALYSIS COMPLETE WITH MICROSCOPIC (ARMC ONLY)
Bilirubin Urine: NEGATIVE
Glucose, UA: NEGATIVE mg/dL
HGB URINE DIPSTICK: NEGATIVE
Ketones, ur: NEGATIVE mg/dL
LEUKOCYTES UA: NEGATIVE
NITRITE: NEGATIVE
PROTEIN: 30 mg/dL — AB
SPECIFIC GRAVITY, URINE: 1.013 (ref 1.005–1.030)
pH: 6 (ref 5.0–8.0)

## 2014-11-22 MED ORDER — OXYCODONE-ACETAMINOPHEN 5-325 MG PO TABS
2.0000 | ORAL_TABLET | Freq: Once | ORAL | Status: AC
  Administered 2014-11-22: 2 via ORAL
  Filled 2014-11-22: qty 2

## 2014-11-22 MED ORDER — DOCUSATE SODIUM 100 MG PO CAPS: 100.0000 mg | ORAL_CAPSULE | Freq: Every day | ORAL | Status: DC | PRN

## 2014-11-22 MED ORDER — OXYCODONE-ACETAMINOPHEN 5-325 MG PO TABS: 1.0000 | ORAL_TABLET | Freq: Four times a day (QID) | ORAL | Status: DC | PRN

## 2014-11-22 NOTE — ED Provider Notes (Signed)
[facility] Emergency Department Provider Note     Time seen: ----------------------------------------- 9:59 AM on 11/22/2014 -----------------------------------------    I have reviewed the triage vital signs and the nursing notes.   HISTORY  Chief Complaint Hip Pain and Fall    HPI Cynthia Glenn is a 81 y.o. female brought by wheelchair from the [facility] assisted living. Family member states patient slipped out of her bed on Saturday and fell to the ground. She's been having right hip pain since that period time. She is still able to ambulate however she has right hip discomfort. Also complaining of burning when she urinates. She is currently taking antibiotic for UTI.   Past Medical History  Diagnosis Date  . Arthritis   . Hypertension   . Dementia   . COPD (chronic obstructive pulmonary disease)   . Shingles     There are no active problems to display for this patient.   History reviewed. No pertinent past surgical history.  Allergies Lisinopril; Statins; and Ciprofloxacin  Social History Social History  Substance Use Topics  . Smoking status: Never Smoker   . Smokeless tobacco: None  . Alcohol Use: No    Review of Systems Constitutional: Negative for fever. Eyes: Negative for visual changes. ENT: Negative for sore throat. Cardiovascular: Negative for chest pain. Respiratory: Negative for shortness of breath. Gastrointestinal: Negative for abdominal pain, vomiting and diarrhea. Genitourinary: Positive for dysuria Musculoskeletal: Positive for right hip pain Skin: Negative for rash. Neurological: Negative for headaches, focal weakness or numbness.  10-point ROS otherwise negative.  ____________________________________________   PHYSICAL EXAM:  VITAL SIGNS: ED Triage Vitals  Enc Vitals Group     BP 11/22/14 0954 178/68 mmHg     Pulse Rate 11/22/14 0954 63     Resp 11/22/14 0954 18     Temp 11/22/14 0954 98.3 F (36.8  C)     Temp Source 11/22/14 0954 Oral     SpO2 11/22/14 0954 97 %     Weight 11/22/14 0954 174 lb (78.926 kg)     Height 11/22/14 0954 5\' 3"  (1.6 m)     Head Cir --      Peak Flow --      Pain Score --      Pain Loc --      Pain Edu? --      Excl. in GC? --     Constitutional: Alert and oriented. Well appearing and in no distress. Eyes: Conjunctivae are normal. PERRL. Normal extraocular movements. Cardiovascular: Normal rate, regular rhythm. Normal and symmetric distal pulses are present in all extremities. No murmurs, rubs, or gallops. Respiratory: Normal respiratory effort without tachypnea nor retractions. Breath sounds are clear and equal bilaterally. No wheezes/rales/rhonchi. Gastrointestinal: Soft and nontender. No distention. No abdominal bruits.  Musculoskeletal: Right hip tenderness, mild pain with range of motion of the right hip. Neurologic:  Normal speech and language. No gross focal neurologic deficits are appreciated. Speech is normal. Skin:  Skin is warm, dry and intact. No rash noted. Psychiatric: Mood and affect are normal. Speech and behavior are normal. Patient exhibits appropriate insight and judgment.  ____________________________________________  ED COURSE:  Pertinent labs & imaging results that were available during my care of the patient were reviewed by me and considered in my medical decision making (see chart for details). We'll check right hip films and urinalysis. ____________________________________________    LABS (pertinent positives/negatives)  Labs Reviewed  URINALYSIS COMPLETEWITH MICROSCOPIC ([facility] ONLY) - Abnormal; Notable for the  following:    Color, Urine YELLOW (*)    APPearance CLEAR (*)    Protein, ur 30 (*)    Bacteria, UA RARE (*)    Squamous Epithelial / LPF 0-5 (*)    All other components within normal limits    RADIOLOGY Right hip x-ray FINDINGS: No acute bony or joint abnormality is identified. Mild degenerative change  is seen about the right hip. Atherosclerotic vascular disease is identified. Surgical clips and suture material seen in the pelvis.  IMPRESSION: No acute abnormality. ____________________________________________  FINAL ASSESSMENT AND PLAN  Fall, right hip contusion  Plan: Patient with labs and imaging as dictated above. Patient is in no acute distress, is able to ambulate with assistance. Does not have signs of UTI at this time. We discharged with pain medication and Colace.   Emily Filbert, MD   Emily Filbert, MD 11/22/14 [phone]

## 2014-11-22 NOTE — ED Notes (Signed)
Patient transported to X-ray 

## 2014-11-22 NOTE — ED Notes (Signed)
Pt to ED via w/c from the [facility] assisted living, family member state pt "slipped" out of her bed on Saturday and fell to ground, has been c/o right hip pain since

## 2014-11-22 NOTE — Discharge Instructions (Signed)
Contusion °A contusion is a deep bruise. Contusions are the result of an injury that caused bleeding under the skin. The contusion may turn blue, purple, or yellow. Minor injuries will give you a painless contusion, but more severe contusions may stay painful and swollen for a few weeks.  °CAUSES  °A contusion is usually caused by a blow, trauma, or direct force to an area of the body. °SYMPTOMS  °· Swelling and redness of the injured area. °· Bruising of the injured area. °· Tenderness and soreness of the injured area. °· Pain. °DIAGNOSIS  °The diagnosis can be made by taking a history and physical exam. An X-ray, CT scan, or MRI may be needed to determine if there were any associated injuries, such as fractures. °TREATMENT  °Specific treatment will depend on what area of the body was injured. In general, the best treatment for a contusion is resting, icing, elevating, and applying cold compresses to the injured area. Over-the-counter medicines may also be recommended for pain control. Ask your caregiver what the best treatment is for your contusion. °HOME CARE INSTRUCTIONS  °· Put ice on the injured area. °¨ Put ice in a plastic bag. °¨ Place a towel between your skin and the bag. °¨ Leave the ice on for 15-20 minutes, 3-4 times a day, or as directed by your health care provider. °· Only take over-the-counter or prescription medicines for pain, discomfort, or fever as directed by your caregiver. Your caregiver may recommend avoiding anti-inflammatory medicines (aspirin, ibuprofen, and naproxen) for 48 hours because these medicines may increase bruising. °· Rest the injured area. °· If possible, elevate the injured area to reduce swelling. °SEEK IMMEDIATE MEDICAL CARE IF:  °· You have increased bruising or swelling. °· You have pain that is getting worse. °· Your swelling or pain is not relieved with medicines. °MAKE SURE YOU:  °· Understand these instructions. °· Will watch your condition. °· Will get help right  away if you are not doing well or get worse. °Document Released: 12/20/2004 Document Revised: 03/17/2013 Document Reviewed: 01/15/2011 °[employer]® Patient Information ©2015 [employer]. This information is not intended to replace advice given to you by your health care provider. Make sure you discuss any questions you have with your health care provider. ° °

## 2014-12-17 ENCOUNTER — Emergency Department: Payer: Medicare Other

## 2014-12-17 ENCOUNTER — Emergency Department
Admission: EM | Admit: 2014-12-17 | Discharge: 2014-12-17 | Disposition: A | Discharge status: Home / Self Care | Payer: Medicare Other | Attending: Emergency Medicine | Admitting: Emergency Medicine

## 2014-12-17 ENCOUNTER — Encounter: Payer: Self-pay | Admitting: Emergency Medicine

## 2014-12-17 DIAGNOSIS — T148 Other injury of unspecified body region: Secondary | ICD-10-CM | POA: Diagnosis not present

## 2014-12-17 DIAGNOSIS — S0990XA Unspecified injury of head, initial encounter: Secondary | ICD-10-CM | POA: Diagnosis not present

## 2014-12-17 DIAGNOSIS — T07XXXA Unspecified multiple injuries, initial encounter: Secondary | ICD-10-CM

## 2014-12-17 DIAGNOSIS — I10 Essential (primary) hypertension: Secondary | ICD-10-CM | POA: Diagnosis not present

## 2014-12-17 DIAGNOSIS — Y998 Other external cause status: Secondary | ICD-10-CM | POA: Insufficient documentation

## 2014-12-17 DIAGNOSIS — Z792 Long term (current) use of antibiotics: Secondary | ICD-10-CM | POA: Diagnosis not present

## 2014-12-17 DIAGNOSIS — W01198A Fall on same level from slipping, tripping and stumbling with subsequent striking against other object, initial encounter: Secondary | ICD-10-CM | POA: Diagnosis not present

## 2014-12-17 DIAGNOSIS — W19XXXA Unspecified fall, initial encounter: Secondary | ICD-10-CM

## 2014-12-17 DIAGNOSIS — Y9289 Other specified places as the place of occurrence of the external cause: Secondary | ICD-10-CM | POA: Diagnosis not present

## 2014-12-17 DIAGNOSIS — Y9389 Activity, other specified: Secondary | ICD-10-CM | POA: Diagnosis not present

## 2014-12-17 DIAGNOSIS — S29001A Unspecified injury of muscle and tendon of front wall of thorax, initial encounter: Secondary | ICD-10-CM | POA: Insufficient documentation

## 2014-12-17 LAB — TROPONIN I

## 2014-12-17 LAB — BASIC METABOLIC PANEL
ANION GAP: 8 (ref 5–15)
BUN: 23 mg/dL — ABNORMAL HIGH (ref 6–20)
CO2: 32 mmol/L (ref 22–32)
Calcium: 9.3 mg/dL (ref 8.9–10.3)
Chloride: 99 mmol/L — ABNORMAL LOW (ref 101–111)
Creatinine, Ser: 0.97 mg/dL (ref 0.44–1.00)
GFR calc non Af Amer: 49 mL/min — ABNORMAL LOW (ref >60)
GFR, EST AFRICAN AMERICAN: 57 mL/min — AB (ref >60)
GLUCOSE: 104 mg/dL — AB (ref 65–99)
POTASSIUM: 3.7 mmol/L (ref 3.5–5.1)
Sodium: 139 mmol/L (ref 135–145)

## 2014-12-17 LAB — CBC
HEMATOCRIT: 42.3 % (ref 35.0–47.0)
HEMOGLOBIN: 13.9 g/dL (ref 12.0–16.0)
MCH: 29.8 pg (ref 26.0–34.0)
MCHC: 32.8 g/dL (ref 32.0–36.0)
MCV: 90.9 fL (ref 80.0–100.0)
Platelets: 178 10*3/uL (ref 150–440)
RBC: 4.66 MIL/uL (ref 3.80–5.20)
RDW: 13.8 % (ref 11.5–14.5)
WBC: 5.8 10*3/uL (ref 3.6–11.0)

## 2014-12-17 NOTE — Discharge Instructions (Signed)
Contusion °A contusion is a deep bruise. Contusions happen when an injury causes bleeding under the skin. Signs of bruising include pain, puffiness (swelling), and discolored skin. The contusion may turn blue, purple, or yellow. °HOME CARE  °· Put ice on the injured area. °· Put ice in a plastic bag. °· Place a towel between your skin and the bag. °· Leave the ice on for 15-20 minutes, 03-04 times a day. °· Only take medicine as told by your doctor. °· Rest the injured area. °· If possible, raise (elevate) the injured area to lessen puffiness. °GET HELP RIGHT AWAY IF:  °· You have more bruising or puffiness. °· You have pain that is getting worse. °· Your puffiness or pain is not helped by medicine. °MAKE SURE YOU:  °· Understand these instructions. °· Will watch your condition. °· Will get help right away if you are not doing well or get worse. °Document Released: 08/29/2007 Document Revised: 06/04/2011 Document Reviewed: 01/15/2011 °ExitCare® Patient Information ©2015 [employer]. This information is not intended to replace advice given to you by your health care provider. Make sure you discuss any questions you have with your health care provider. ° °Fall Prevention and Home Safety °Falls cause injuries and can affect all age groups. It is possible to prevent falls.  °HOW TO PREVENT FALLS °· Wear shoes with rubber soles that do not have an opening for your toes. °· Keep the inside and outside of your house well lit. °· Use night lights throughout your home. °· Remove clutter from floors. °· Clean up floor spills. °· Remove throw rugs or fasten them to the floor with carpet tape. °· Do not place electrical cords across pathways. °· Put grab bars by your tub, shower, and toilet. Do not use towel bars as grab bars. °· Put handrails on both sides of the stairway. Fix loose handrails. °· Do not climb on stools or stepladders, if possible. °· Do not wax your floors. °· Repair uneven or unsafe sidewalks, walkways, or  stairs. °· Keep items you use a lot within reach. °· Be aware of pets. °· Keep emergency numbers next to the telephone. °· Put smoke detectors in your home and near bedrooms. °Ask your doctor what other things you can do to prevent falls. °Document Released: 01/06/2009 Document Revised: 09/11/2011 Document Reviewed: 06/12/2011 °ExitCare® Patient Information ©2015 [employer]. This information is not intended to replace advice given to you by your health care provider. Make sure you discuss any questions you have with your health care provider. ° °

## 2014-12-17 NOTE — ED Provider Notes (Signed)
[facility] Emergency Department Provider Note  Time seen: 1:49 PM  I have reviewed the triage vital signs and the nursing notes.   HISTORY  Chief Complaint Fall and Chest Pain    HPI CAROLE DONER is a 81 y.o. female with a past medical history of arthritis, dementia, hypertension, COPD, presents the emergency department after a fall. According to the patient's daughter she was visiting the patient and the patient got tripped up on her walker and fell. States she thinks the patient may of hit her head but denies loss of consciousness. Denies vomiting. Patient only complaint is of midsternal chest pain. She states this has been going on for "sometime." But cannot specify a time line. Patient is unclear if this happened during the fall or if this is been present before. Daughter is unsure either. States her chest pain is mild currently. No modifying factors identified.     Past Medical History  Diagnosis Date  . Arthritis   . Hypertension   . Dementia   . COPD (chronic obstructive pulmonary disease)   . Shingles     There are no active problems to display for this patient.   History reviewed. No pertinent past surgical history.  Current Outpatient Rx  Name  Route  Sig  Dispense  Refill  . cephALEXin (KEFLEX) 500 MG capsule   Oral   Take 1 capsule (500 mg total) by mouth 4 (four) times daily.   40 capsule   0   . docusate sodium (COLACE) 100 MG capsule   Oral   Take 1 capsule (100 mg total) by mouth daily as needed.   30 capsule   2   . oxyCODONE-acetaminophen (ROXICET) 5-325 MG per tablet   Oral   Take 1 tablet by mouth every 6 (six) hours as needed.   20 tablet   0     Allergies Lisinopril; Statins; and Ciprofloxacin  No family history on file.  Social History Social History  Substance Use Topics  . Smoking status: Never Smoker   . Smokeless tobacco: None  . Alcohol Use: No    Review of Systems Constitutional: Negative  for fever. Cardiovascular: Positive for mild chest pain. Respiratory: Negative for shortness of breath. Gastrointestinal: Negative for abdominal pain Musculoskeletal: Negative for back pain. Negative for neck pain. Neurological: Negative for headaches, focal weakness or numbness 10-point ROS otherwise negative.  ____________________________________________   PHYSICAL EXAM:  VITAL SIGNS: ED Triage Vitals  Enc Vitals Group     BP 12/17/14 1253 167/55 mmHg     Pulse Rate 12/17/14 1253 70     Resp 12/17/14 1253 22     Temp 12/17/14 1253 97.7 F (36.5 C)     Temp Source 12/17/14 1253 Oral     SpO2 12/17/14 1253 99 %     Weight 12/17/14 1253 152 lb (68.947 kg)     Height 12/17/14 1253  (1.626 m)     Head Cir --      Peak Flow --      Pain Score 12/17/14 1254 10     Pain Loc --      Pain Edu? --      Excl. in GC? --     Constitutional: Alert. Well appearing and in no distress. Eyes: Normal exam ENT   Head: Normocephalic and atraumatic. Neck is nontender on exam.   Mouth/Throat: Mucous membranes are moist. Cardiovascular: Normal rate, regular rhythm. Respiratory: Normal respiratory effort without tachypnea nor retractions.  Breath sounds are clear and equal bilaterally. Mild sternal tenderness palpation. Gastrointestinal: Soft and nontender. No distention.   Musculoskeletal: Nontender with normal range of motion in all extremities. No lower extremity tenderness or edema. Pelvis is stable. No signs of extremity trauma. No neck or back tenderness to palpation. Neurologic:  Normal speech and language. No gross focal neurologic deficits are appreciated. Speech is normal. Skin:  Skin is warm, dry and intact.  Psychiatric: Mood and affect are normal. Speech and behavior are normal. Patient exhibits appropriate insight and judgment.  ____________________________________________    EKG  EKG reviewed and interpreted by myself shows sinus bradycardia at 59 bpm, narrow QRS,  left axis deviation, prolonged PR interval consistent with first-degree AV block. Nonspecific ST changes present. No ST elevations noted.  ____________________________________________    RADIOLOGY  Chest x-ray shows no acute abnormality. CT head shows no acute abnormality.  ____________________________________________    INITIAL IMPRESSION / ASSESSMENT AND PLAN / ED COURSE  Pertinent labs & imaging results that were available during my care of the patient were reviewed by me and considered in my medical decision making (see chart for details).  Patient with what appears to be mechanical fall. We will check a head CT as well as a chest x-ray. Mild chest wall tenderness on exam. Given her chest pain we'll also check an EKG as well as basic lab work including a troponin. Patient and daughter are agreeable to plan.  Labs are within normal limits including negative troponin. EKG and chest x-ray shows no acute abnormalities. We will discharge patient home with primary care follow-up. Family is here and will take the patient home.  ____________________________________________   FINAL CLINICAL IMPRESSION(S) / ED DIAGNOSES  Fall Contusions   Minna Antis, MD 12/17/14 [phone]

## 2014-12-17 NOTE — ED Notes (Signed)
Pt to ed via ems from the [facility] nursing home. Pt got tangled up in her walker and fell backwards. No loc at time of fall. Ems reports pt c/o chest pain at time of arrival to home. Per ems all vital signs normal. Some abnormality to ekg per ems.  Pt arrives alert and oriented c/o central chest pain, describes as sore. No acute distress noted. Vss.

## 2015-01-01 ENCOUNTER — Inpatient Hospital Stay
Admission: EM | Admit: 2015-01-01 | Discharge: 2015-01-03 | DRG: 603 | Disposition: A | Discharge status: Skilled Nursing Facility | Payer: Medicare Other | Attending: Internal Medicine | Admitting: Internal Medicine

## 2015-01-01 ENCOUNTER — Encounter: Payer: Self-pay | Admitting: Emergency Medicine

## 2015-01-01 ENCOUNTER — Emergency Department: Payer: Medicare Other

## 2015-01-01 DIAGNOSIS — Z888 Allergy status to other drugs, medicaments and biological substances status: Secondary | ICD-10-CM

## 2015-01-01 DIAGNOSIS — I6529 Occlusion and stenosis of unspecified carotid artery: Secondary | ICD-10-CM | POA: Diagnosis present

## 2015-01-01 DIAGNOSIS — E538 Deficiency of other specified B group vitamins: Secondary | ICD-10-CM | POA: Diagnosis present

## 2015-01-01 DIAGNOSIS — I739 Peripheral vascular disease, unspecified: Secondary | ICD-10-CM | POA: Diagnosis present

## 2015-01-01 DIAGNOSIS — K219 Gastro-esophageal reflux disease without esophagitis: Secondary | ICD-10-CM | POA: Diagnosis present

## 2015-01-01 DIAGNOSIS — Z881 Allergy status to other antibiotic agents status: Secondary | ICD-10-CM | POA: Diagnosis not present

## 2015-01-01 DIAGNOSIS — M1990 Unspecified osteoarthritis, unspecified site: Secondary | ICD-10-CM | POA: Diagnosis present

## 2015-01-01 DIAGNOSIS — K122 Cellulitis and abscess of mouth: Secondary | ICD-10-CM | POA: Diagnosis present

## 2015-01-01 DIAGNOSIS — J302 Other seasonal allergic rhinitis: Secondary | ICD-10-CM | POA: Diagnosis present

## 2015-01-01 DIAGNOSIS — Z8673 Personal history of transient ischemic attack (TIA), and cerebral infarction without residual deficits: Secondary | ICD-10-CM

## 2015-01-01 DIAGNOSIS — L0201 Cutaneous abscess of face: Secondary | ICD-10-CM

## 2015-01-01 DIAGNOSIS — I10 Essential (primary) hypertension: Secondary | ICD-10-CM | POA: Diagnosis present

## 2015-01-01 DIAGNOSIS — E559 Vitamin D deficiency, unspecified: Secondary | ICD-10-CM | POA: Diagnosis present

## 2015-01-01 DIAGNOSIS — L03221 Cellulitis of neck: Secondary | ICD-10-CM | POA: Diagnosis present

## 2015-01-01 DIAGNOSIS — Z79899 Other long term (current) drug therapy: Secondary | ICD-10-CM

## 2015-01-01 DIAGNOSIS — G309 Alzheimer's disease, unspecified: Secondary | ICD-10-CM | POA: Diagnosis present

## 2015-01-01 DIAGNOSIS — J449 Chronic obstructive pulmonary disease, unspecified: Secondary | ICD-10-CM | POA: Diagnosis present

## 2015-01-01 DIAGNOSIS — F0280 Dementia in other diseases classified elsewhere without behavioral disturbance: Secondary | ICD-10-CM | POA: Diagnosis present

## 2015-01-01 DIAGNOSIS — L0211 Cutaneous abscess of neck: Secondary | ICD-10-CM | POA: Diagnosis present

## 2015-01-01 DIAGNOSIS — Z79891 Long term (current) use of opiate analgesic: Secondary | ICD-10-CM

## 2015-01-01 DIAGNOSIS — E785 Hyperlipidemia, unspecified: Secondary | ICD-10-CM | POA: Diagnosis present

## 2015-01-01 DIAGNOSIS — Q613 Polycystic kidney, unspecified: Secondary | ICD-10-CM

## 2015-01-01 DIAGNOSIS — Z7982 Long term (current) use of aspirin: Secondary | ICD-10-CM | POA: Diagnosis not present

## 2015-01-01 HISTORY — DX: Peripheral vascular disease, unspecified: I73.9

## 2015-01-01 HISTORY — DX: Cerebral infarction, unspecified: I63.9

## 2015-01-01 HISTORY — DX: Hyperlipidemia, unspecified: E78.5

## 2015-01-01 HISTORY — DX: Vitamin D deficiency, unspecified: E55.9

## 2015-01-01 HISTORY — DX: Deficiency of other specified B group vitamins: E53.8

## 2015-01-01 HISTORY — DX: Essential tremor: G25.0

## 2015-01-01 HISTORY — DX: Essential (primary) hypertension: I10

## 2015-01-01 HISTORY — DX: Other seasonal allergic rhinitis: J30.2

## 2015-01-01 HISTORY — DX: Polycystic kidney, unspecified: Q61.3

## 2015-01-01 HISTORY — DX: Gastro-esophageal reflux disease without esophagitis: K21.9

## 2015-01-01 LAB — BASIC METABOLIC PANEL
ANION GAP: 8 (ref 5–15)
BUN: 14 mg/dL (ref 6–20)
CO2: 27 mmol/L (ref 22–32)
Calcium: 9.1 mg/dL (ref 8.9–10.3)
Chloride: 107 mmol/L (ref 101–111)
Creatinine, Ser: 1.04 mg/dL — ABNORMAL HIGH (ref 0.44–1.00)
GFR calc Af Amer: 52 mL/min — ABNORMAL LOW (ref >60)
GFR calc non Af Amer: 45 mL/min — ABNORMAL LOW (ref >60)
GLUCOSE: 97 mg/dL (ref 65–99)
POTASSIUM: 4.8 mmol/L (ref 3.5–5.1)
Sodium: 142 mmol/L (ref 135–145)

## 2015-01-01 LAB — URINALYSIS COMPLETE WITH MICROSCOPIC (ARMC ONLY)
BACTERIA UA: NONE SEEN
Bilirubin Urine: NEGATIVE
Glucose, UA: NEGATIVE mg/dL
Hgb urine dipstick: NEGATIVE
Ketones, ur: NEGATIVE mg/dL
Leukocytes, UA: NEGATIVE
Nitrite: NEGATIVE
PH: 7 (ref 5.0–8.0)
PROTEIN: NEGATIVE mg/dL
Specific Gravity, Urine: 1.008 (ref 1.005–1.030)

## 2015-01-01 LAB — CBC WITH DIFFERENTIAL/PLATELET
BASOS ABS: 0.1 10*3/uL (ref 0–0.1)
Basophils Relative: 1 %
Eosinophils Absolute: 0.2 10*3/uL (ref 0–0.7)
Eosinophils Relative: 2 %
HEMATOCRIT: 40.1 % (ref 35.0–47.0)
Hemoglobin: 13.3 g/dL (ref 12.0–16.0)
LYMPHS PCT: 9 %
Lymphs Abs: 0.8 10*3/uL — ABNORMAL LOW (ref 1.0–3.6)
MCH: 29.4 pg (ref 26.0–34.0)
MCHC: 33.1 g/dL (ref 32.0–36.0)
MCV: 88.8 fL (ref 80.0–100.0)
Monocytes Absolute: 0.6 10*3/uL (ref 0.2–0.9)
Monocytes Relative: 7 %
NEUTROS ABS: 6.9 10*3/uL — AB (ref 1.4–6.5)
Neutrophils Relative %: 81 %
Platelets: 269 10*3/uL (ref 150–440)
RBC: 4.52 MIL/uL (ref 3.80–5.20)
RDW: 14.2 % (ref 11.5–14.5)
WBC: 8.6 10*3/uL (ref 3.6–11.0)

## 2015-01-01 LAB — SEDIMENTATION RATE: Sed Rate: 42 mm/hr — ABNORMAL HIGH (ref 0–30)

## 2015-01-01 MED ORDER — DONEPEZIL HCL 5 MG PO TABS
10.0000 mg | ORAL_TABLET | Freq: Every day | ORAL | Status: DC
  Administered 2015-01-01 – 2015-01-02 (×2): 10 mg via ORAL
  Filled 2015-01-01 (×2): qty 2

## 2015-01-01 MED ORDER — IRBESARTAN 75 MG PO TABS
37.5000 mg | ORAL_TABLET | Freq: Every day | ORAL | Status: DC
  Administered 2015-01-02 – 2015-01-03 (×2): 37.5 mg via ORAL
  Filled 2015-01-01 (×2): qty 1

## 2015-01-01 MED ORDER — VITAMIN B-12 1000 MCG PO TABS
500.0000 ug | ORAL_TABLET | Freq: Every day | ORAL | Status: DC
  Administered 2015-01-02 – 2015-01-03 (×2): 500 ug via ORAL
  Filled 2015-01-01 (×2): qty 1

## 2015-01-01 MED ORDER — HEPARIN SODIUM (PORCINE) 5000 UNIT/ML IJ SOLN
5000.0000 [IU] | Freq: Three times a day (TID) | INTRAMUSCULAR | Status: DC
  Administered 2015-01-01 – 2015-01-02 (×3): 5000 [IU] via SUBCUTANEOUS
  Filled 2015-01-01 (×5): qty 1

## 2015-01-01 MED ORDER — ACETAMINOPHEN 325 MG PO TABS: 650.0000 mg | ORAL_TABLET | Freq: Four times a day (QID) | ORAL | Status: DC | PRN

## 2015-01-01 MED ORDER — SODIUM CHLORIDE 0.9 % IV SOLN
INTRAVENOUS | Status: DC
  Administered 2015-01-01 – 2015-01-02 (×2): via INTRAVENOUS

## 2015-01-01 MED ORDER — FLUTICASONE PROPIONATE 50 MCG/ACT NA SUSP
1.0000 | Freq: Every day | NASAL | Status: DC
  Administered 2015-01-02 – 2015-01-03 (×2): 1 via NASAL
  Filled 2015-01-01: qty 16

## 2015-01-01 MED ORDER — IOHEXOL 300 MG/ML  SOLN
60.0000 mL | Freq: Once | INTRAMUSCULAR | Status: AC | PRN
  Administered 2015-01-01: 60 mL via INTRAVENOUS
  Filled 2015-01-01: qty 60

## 2015-01-01 MED ORDER — BUSPIRONE HCL 5 MG PO TABS
7.5000 mg | ORAL_TABLET | Freq: Three times a day (TID) | ORAL | Status: DC
  Administered 2015-01-01 – 2015-01-02 (×3): 7.5 mg via ORAL
  Administered 2015-01-02: 17:00:00 via ORAL
  Administered 2015-01-03: 7.5 mg via ORAL
  Filled 2015-01-01: qty 2
  Filled 2015-01-01 (×3): qty 1
  Filled 2015-01-01: qty 2
  Filled 2015-01-01: qty 1
  Filled 2015-01-01 (×2): qty 2

## 2015-01-01 MED ORDER — ONDANSETRON HCL 4 MG/2ML IJ SOLN: 4.0000 mg | Freq: Four times a day (QID) | INTRAMUSCULAR | Status: DC | PRN

## 2015-01-01 MED ORDER — LIDOCAINE-EPINEPHRINE 1 %-1:100000 IJ SOLN
10.0000 mL | Freq: Once | INTRAMUSCULAR | Status: AC
  Administered 2015-01-01: 19:00:00 10 mL
  Filled 2015-01-01: qty 10

## 2015-01-01 MED ORDER — ASPIRIN EC 81 MG PO TBEC
81.0000 mg | DELAYED_RELEASE_TABLET | Freq: Every day | ORAL | Status: DC
  Administered 2015-01-02 – 2015-01-03 (×2): 81 mg via ORAL
  Filled 2015-01-01 (×4): qty 1

## 2015-01-01 MED ORDER — ONDANSETRON HCL 4 MG PO TABS: 4.0000 mg | ORAL_TABLET | Freq: Four times a day (QID) | ORAL | Status: DC | PRN

## 2015-01-01 MED ORDER — QUETIAPINE FUMARATE 25 MG PO TABS
150.0000 mg | ORAL_TABLET | Freq: Every day | ORAL | Status: DC
  Administered 2015-01-01 – 2015-01-02 (×2): 150 mg via ORAL
  Filled 2015-01-01 (×2): qty 6

## 2015-01-01 MED ORDER — ATENOLOL 50 MG PO TABS
100.0000 mg | ORAL_TABLET | Freq: Every day | ORAL | Status: DC
  Administered 2015-01-02 – 2015-01-03 (×2): 100 mg via ORAL
  Filled 2015-01-01 (×2): qty 2

## 2015-01-01 MED ORDER — OCUVITE-LUTEIN PO CAPS
1.0000 | ORAL_CAPSULE | Freq: Every day | ORAL | Status: DC
  Administered 2015-01-02 – 2015-01-03 (×2): 1 via ORAL
  Filled 2015-01-01 (×2): qty 1

## 2015-01-01 MED ORDER — HYDROCODONE-ACETAMINOPHEN 5-325 MG PO TABS
1.0000 | ORAL_TABLET | ORAL | Status: DC | PRN
  Administered 2015-01-01 – 2015-01-03 (×3): 1 via ORAL
  Filled 2015-01-01 (×3): qty 1

## 2015-01-01 MED ORDER — AMLODIPINE BESYLATE 10 MG PO TABS
10.0000 mg | ORAL_TABLET | Freq: Every day | ORAL | Status: DC
Start: 2015-01-01 — End: 2015-01-03
  Administered 2015-01-02 – 2015-01-03 (×2): 10 mg via ORAL
  Filled 2015-01-01 (×2): qty 1

## 2015-01-01 MED ORDER — VANCOMYCIN HCL IN DEXTROSE 750-5 MG/150ML-% IV SOLN
750.0000 mg | INTRAVENOUS | Status: DC
  Filled 2015-01-01: qty 150

## 2015-01-01 MED ORDER — ALBUTEROL SULFATE (2.5 MG/3ML) 0.083% IN NEBU: 2.5000 mg | INHALATION_SOLUTION | Freq: Four times a day (QID) | RESPIRATORY_TRACT | Status: DC | PRN

## 2015-01-01 MED ORDER — CALCIUM CARBONATE-VITAMIN D 500-200 MG-UNIT PO TABS
1.0000 | ORAL_TABLET | Freq: Two times a day (BID) | ORAL | Status: DC
  Administered 2015-01-01 – 2015-01-03 (×4): 1 via ORAL
  Filled 2015-01-01 (×4): qty 1

## 2015-01-01 MED ORDER — BACID PO TABS
2.0000 | ORAL_TABLET | Freq: Three times a day (TID) | ORAL | Status: DC
  Administered 2015-01-01 – 2015-01-02 (×2): 2 via ORAL
  Administered 2015-01-02: 11:00:00 1 via ORAL
  Administered 2015-01-02 – 2015-01-03 (×2): 2 via ORAL
  Filled 2015-01-01 (×7): qty 2

## 2015-01-01 MED ORDER — ACETAMINOPHEN 650 MG RE SUPP: 650.0000 mg | Freq: Four times a day (QID) | RECTAL | Status: DC | PRN

## 2015-01-01 MED ORDER — SODIUM CHLORIDE 0.9 % IV SOLN
1250.0000 mg | INTRAVENOUS | Status: AC
  Administered 2015-01-01: 19:00:00 1250 mg via INTRAVENOUS
  Filled 2015-01-01 (×2): qty 1250

## 2015-01-01 MED ORDER — MORPHINE SULFATE (PF) 2 MG/ML IV SOLN
2.0000 mg | INTRAVENOUS | Status: AC
  Administered 2015-01-01: 19:00:00 2 mg via INTRAVENOUS
  Filled 2015-01-01: qty 1

## 2015-01-01 MED ORDER — DOCUSATE SODIUM 100 MG PO CAPS: 100.0000 mg | ORAL_CAPSULE | Freq: Every day | ORAL | Status: DC | PRN

## 2015-01-01 MED ORDER — ONDANSETRON HCL 4 MG PO TABS: 4.0000 mg | ORAL_TABLET | Freq: Three times a day (TID) | ORAL | Status: DC | PRN

## 2015-01-01 MED ORDER — PRAVASTATIN SODIUM 20 MG PO TABS
20.0000 mg | ORAL_TABLET | Freq: Every day | ORAL | Status: DC
  Administered 2015-01-02 – 2015-01-03 (×2): 20 mg via ORAL
  Filled 2015-01-01 (×2): qty 1

## 2015-01-01 NOTE — Consult Note (Signed)
ANTIBIOTIC CONSULT NOTE - INITIAL  Pharmacy Consult for vancomycin/amp/sul Indication: cellulitis/abcess  Allergies  Allergen Reactions  . Lisinopril Cough  . Statins Hives  . Ciprofloxacin     Patient Measurements: Height:  (167.6 cm) Weight: 145 lb (65.772 kg) IBW/kg (Calculated) : 59.3 Adjusted Body Weight:   Vital Signs: Temp: 97.7 F (36.5 C) (10/08 1144) Temp Source: Oral (10/08 1144) BP: 152/53 mmHg (10/08 1144) Pulse Rate: 79 (10/08 1144) Intake/Output from previous day:   Intake/Output from this shift:    Labs:  Recent Labs  01/01/15 1430  WBC 8.6  HGB 13.3  PLT 269  CREATININE 1.04*   Estimated Creatinine Clearance: 32.3 mL/min (by C-G formula based on Cr of 1.04). No results for input(s): VANCOTROUGH, VANCOPEAK, VANCORANDOM, GENTTROUGH, GENTPEAK, GENTRANDOM, TOBRATROUGH, TOBRAPEAK, TOBRARND, AMIKACINPEAK, AMIKACINTROU, AMIKACIN in the last 72 hours.   Microbiology: No results found for this or any previous visit (from the past 720 hour(s)).  Medical History: Past Medical History  Diagnosis Date  . Arthritis   . Hypertension   . Dementia   . COPD (chronic obstructive pulmonary disease) (HCC)   . Shingles   . Seasonal allergies   . Polycystic kidney disease   . Vitamin B12 deficiency   . Dementia   . PVD (peripheral vascular disease) (HCC)   . Vitamin D deficiency   . Essential tremor   . CVA (cerebral infarction)   . GERD (gastroesophageal reflux disease)   . HTN (hypertension)   . Hyperlipemia     Medications:  Scheduled:  . amLODipine  10 mg Oral Daily  . aspirin  81 mg Oral Daily  . atenolol  100 mg Oral Daily  . busPIRone  7.5 mg Oral TID  . calcium-vitamin D  1 tablet Oral BID  . donepezil  10 mg Oral QHS  . fluticasone  1 spray Each Nare Daily  . heparin  5,000 Units Subcutaneous 3 times per day  . irbesartan  37.5 mg Oral Daily  . lactobacillus acidophilus  2 tablet Oral TID  . multivitamin-lutein  1 capsule Oral  Daily  . pravastatin  20 mg Oral Daily  . QUEtiapine  150 mg Oral QHS  . vitamin B-12  500 mcg Oral Daily   Assessment: Pt is a 81 year old female who presents with an abscess/cellutlis. Pharmacy consulted to dose vancomycin and amp/sulbactam. Patient was on bactrim and keflex at her facility but swelling continued to worsen Ke=0.031 vd=46 T1/2=22 hr  Goal of Therapy:  Vancomycin trough level 10-15 mcg/ml  Plan:  Measure antibiotic drug levels at steady state Follow up culture results will load patient with  of vancomycin and then start  q 24 hours. Will give amp/sul 3g q 8 hours. pharmacy to continue to monitor renal function and dose adjust as needed  Will draw trough at steady state 10/13 @ 1800  Jowel Waltner D Malyk Girouard 01/01/2015,5:08 PM

## 2015-01-01 NOTE — ED Notes (Signed)
Pt has large submandibular abscess to right side.  Family states 2 different abx and some purulent drainage.

## 2015-01-01 NOTE — Progress Notes (Signed)
Patient got to the floor, VVS, hung IV ABX and IV NS at 65ml/ hr Dr Irving Shows performed I&D of abcess in room, culture sent to lab

## 2015-01-01 NOTE — H&P (Signed)
[facility] [place] at [facility]   PATIENT NAME: Cynthia Glenn    MR#:  [id number]  DATE OF BIRTH:  [birth year]  DATE OF ADMISSION:  01/01/2015  PRIMARY CARE PHYSICIAN: PROVIDER NOT IN SYSTEM   REQUESTING/REFERRING PHYSICIAN: Dr. Marge Duncans  CHIEF COMPLAINT:   Chief Complaint  Patient presents with  . Abscess    HISTORY OF PRESENT ILLNESS: Cynthia Glenn  is a 81 y.o. female with a known history of arthritis, hypertension, dementia, COPD, shingles, history of CVA, hypertension and hyperlipidemia who currently resides in assisted living facility who is has swelling on the right chin swelling and a boil. Patient has been treated with oral anabiotic's for the past few weeks by the physician that visits her at the facility. He instructed the patient's family that if the swelling did not improve he recommends patient be brought to the emergency room. Therefore she was brought to the emergency room. In the emergency room she had a CT scan which showed a abscess. The case was discussed with ENT they will be coming later to drain the abscess. Patient is a very poor historian due to her dementia. Does complaint of right  pain below her right breast.   PAST MEDICAL HISTORY:   Past Medical History  Diagnosis Date  . Arthritis   . Hypertension   . Dementia   . COPD (chronic obstructive pulmonary disease) (HCC)   . Shingles   . Seasonal allergies   . Polycystic kidney disease   . Vitamin B12 deficiency   . Dementia   . PVD (peripheral vascular disease) (HCC)   . Vitamin D deficiency   . Essential tremor   . CVA (cerebral infarction)   . GERD (gastroesophageal reflux disease)   . HTN (hypertension)   . Hyperlipemia     PAST SURGICAL HISTORY:  Past Surgical History  Procedure Laterality Date  . Hysterotomy    . Hernia repair    . Orif ankle fracture    . Cataract extraction    . Colon surgery      SOCIAL HISTORY:  Social History  Substance Use  Topics  . Smoking status: Never Smoker   . Smokeless tobacco: Not on file  . Alcohol Use: No    FAMILY HISTORY:  Family History  Problem Relation Age of Onset  . Hypertension      DRUG ALLERGIES:  Allergies  Allergen Reactions  . Lisinopril Cough  . Statins Hives  . Ciprofloxacin     REVIEW OF SYSTEMS:    Limited due to her dementia MEDICATIONS AT HOME:  Prior to Admission medications   Medication Sig Start Date End Date Taking? Authorizing Provider  albuterol (PROVENTIL) (2.5 MG/3ML) 0.083% nebulizer solution Take 2.5 mg by nebulization every 6 (six) hours as needed for wheezing or shortness of breath.   Yes Historical Provider, MD  amLODipine (NORVASC) 10 MG tablet Take 10 mg by mouth daily.   Yes Historical Provider, MD  amoxicillin-clavulanate (AUGMENTIN) 875-125 MG tablet Take 1 tablet by mouth 2 (two) times daily.   Yes Historical Provider, MD  aspirin 81 MG tablet Take 81 mg by mouth daily.   Yes Historical Provider, MD  atenolol (TENORMIN) 100 MG tablet Take 100 mg by mouth daily.   Yes Historical Provider, MD  benzonatate (TESSALON) 100 MG capsule Take by mouth 3 (three) times daily as needed for cough.   Yes Historical Provider, MD  busPIRone (BUSPAR) 7.5 MG tablet Take 7.5 mg by mouth 3 (  three) times daily.   Yes Historical Provider, MD  calcium-vitamin D (OSCAL WITH D) 500-200 MG-UNIT tablet Take 1 tablet by mouth.   Yes Historical Provider, MD  donepezil (ARICEPT) 10 MG tablet Take 10 mg by mouth at bedtime.   Yes Historical Provider, MD  fluticasone (FLONASE) 50 MCG/ACT nasal spray Place into both nostrils daily.   Yes Historical Provider, MD  HYDROcodone-acetaminophen (NORCO/VICODIN) 5-325 MG tablet Take 1 tablet by mouth every 6 (six) hours as needed for moderate pain.   Yes Historical Provider, MD  lactobacillus acidophilus (BACID) TABS tablet Take 2 tablets by mouth 3 (three) times daily.   Yes Historical Provider, MD  multivitamin-lutein (OCUVITE-LUTEIN) CAPS  capsule Take 1 capsule by mouth daily.   Yes Historical Provider, MD  ondansetron (ZOFRAN) 4 MG tablet Take 4 mg by mouth every 8 (eight) hours as needed for nausea or vomiting.   Yes Historical Provider, MD  pravastatin (PRAVACHOL) 20 MG tablet Take 20 mg by mouth daily.   Yes Historical Provider, MD  QUEtiapine (SEROQUEL) 100 MG tablet Take 150 mg by mouth at bedtime.   Yes Historical Provider, MD  sulfamethoxazole-trimethoprim (BACTRIM DS,SEPTRA DS) 800-160 MG tablet Take 1 tablet by mouth 2 (two) times daily.   Yes Historical Provider, MD  valsartan (DIOVAN) 320 MG tablet Take 320 mg by mouth daily.   Yes Historical Provider, MD  vitamin B-12 (CYANOCOBALAMIN) 500 MCG tablet Take 500 mcg by mouth daily.   Yes Historical Provider, MD  cephALEXin (KEFLEX) 500 MG capsule Take 1 capsule (500 mg total) by mouth 4 (four) times daily. 09/06/14   Sharyn Creamer, MD  docusate sodium (COLACE) 100 MG capsule Take 1 capsule (100 mg total) by mouth daily as needed. 11/22/14 11/22/15  Emily Filbert, MD  oxyCODONE-acetaminophen (ROXICET) 5-325 MG per tablet Take 1 tablet by mouth every 6 (six) hours as needed. 11/22/14   Emily Filbert, MD      PHYSICAL EXAMINATION:   VITAL SIGNS: Blood pressure 152/53, pulse 79, temperature 97.7 F (36.5 C), temperature source Oral, resp. rate 18, height 5\' 6"  (1.676 m), weight 65.772 kg (145 lb), SpO2 96 %.  GENERAL:  81 y.o.-year-old patient lying in the bed with no acute distress.  EYES: Pupils equal, round, reactive to light and accommodation. No scleral icterus. Extraocular muscles intact.  HEENT: Head atraumatic, normocephalic. Oropharynx and nasopharynx clear.  she has a large area of induration in the right submandibular area with a large abscess in Place. NECK:  Supple, no jugular venous distention. No thyroid enlargement, no tenderness.  LUNGS: Normal breath sounds bilaterally, no wheezing, rales,rhonchi or crepitation. No use of accessory muscles of  respiration.  CARDIOVASCULAR: S1, S2 normal. No murmurs, rubs, or gallops.  ABDOMEN: Soft, nontender, nondistended. Bowel sounds present. No organomegaly or mass.  EXTREMITIES: No pedal edema, cyanosis, or clubbing.  NEUROLOGIC: Cranial nerves II through XII are intact. Muscle strength 5/5 in all extremities. Sensation intact. Gait not checked.  PSYCHIATRIC:  patient is not agitated SKIN: No obvious rash, lesion, or ulcer.   LABORATORY PANEL:   CBC  Recent Labs Lab 01/01/15 1430  WBC 8.6  HGB 13.3  HCT 40.1  PLT 269  MCV 88.8  MCH 29.4  MCHC 33.1  RDW 14.2  LYMPHSABS 0.8*  MONOABS 0.6  EOSABS 0.2  BASOSABS 0.1   ------------------------------------------------------------------------------------------------------------------  Chemistries   Recent Labs Lab 01/01/15 1430  NA 142  K 4.8  CL 107  CO2 27  GLUCOSE 97  BUN  14  CREATININE 1.04*  CALCIUM 9.1   ------------------------------------------------------------------------------------------------------------------ estimated creatinine clearance is 32.3 mL/min (by C-G formula based on Cr of 1.04). ------------------------------------------------------------------------------------------------------------------ No results for input(s): TSH, T4TOTAL, T3FREE, THYROIDAB in the last 72 hours.  Invalid input(s): FREET3   Coagulation profile No results for input(s): INR, PROTIME in the last 168 hours. ------------------------------------------------------------------------------------------------------------------- No results for input(s): DDIMER in the last 72 hours. -------------------------------------------------------------------------------------------------------------------  Cardiac Enzymes No results for input(s): CKMB, TROPONINI, MYOGLOBIN in the last 168 hours.  Invalid input(s):  CK ------------------------------------------------------------------------------------------------------------------ Invalid input(s): POCBNP  ---------------------------------------------------------------------------------------------------------------  Urinalysis    Component Value Date/Time   COLORURINE STRAW* 01/01/2015 1459   COLORURINE Yellow 09/17/2013 1801   APPEARANCEUR CLEAR* 01/01/2015 1459   APPEARANCEUR Cloudy 09/17/2013 1801   LABSPEC 1.008 01/01/2015 1459   LABSPEC 1.017 09/17/2013 1801   PHURINE 7.0 01/01/2015 1459   PHURINE 7.0 09/17/2013 1801   GLUCOSEU NEGATIVE 01/01/2015 1459   GLUCOSEU Negative 09/17/2013 1801   HGBUR NEGATIVE 01/01/2015 1459   HGBUR Negative 09/17/2013 1801   BILIRUBINUR NEGATIVE 01/01/2015 1459   BILIRUBINUR Negative 09/17/2013 1801   KETONESUR NEGATIVE 01/01/2015 1459   KETONESUR Negative 09/17/2013 1801   PROTEINUR NEGATIVE 01/01/2015 1459   PROTEINUR 30 mg/dL 16/12/9602 5409   NITRITE NEGATIVE 01/01/2015 1459   NITRITE Positive 09/17/2013 1801   LEUKOCYTESUR NEGATIVE 01/01/2015 1459   LEUKOCYTESUR Negative 09/17/2013 1801     RADIOLOGY: Ct Soft Tissue Neck W Contrast  01/01/2015   CLINICAL DATA:  Right submandibular abscess.  EXAM: CT NECK WITH CONTRAST  TECHNIQUE: Multidetector CT imaging of the neck was performed using the standard protocol following the bolus administration of intravenous contrast.  CONTRAST:  60mL OMNIPAQUE IOHEXOL 300 MG/ML  SOLN  COMPARISON:  None.  FINDINGS: Pharynx and larynx: No definite abnormality is noted. Larynx appears normal. Epiglottis appears normal.  Salivary glands: Parotid and submandibular glands appear normal. Irregular fluid collection measuring 3.1 x 2.5 x 1.3 cm is seen in the superficial portion the right mandibular region consistent with abscess.  Thyroid: 2.7 x 1.5 cm solid mass is seen arising inferiorly from the left thyroid lobe. Thyroid ultrasound is recommended for further evaluation.   Lymph nodes: No significantly enlarged adenopathy is noted.  Vascular: Extensive calcifications are seen involving the carotid bulb and proximal right internal carotid arteries suggesting carotid artery atherosclerosis.  Limited intracranial: No definite abnormality seen.  Visualized orbits: Visualized portions appear normal.  Mastoids and visualized paranasal sinuses: Normal.  Skeleton: Degenerative disc disease is noted at C4-5, C5-6 and C6-7.  Upper chest: Visualized lung parenchyma appears normal. Atherosclerosis of thoracic aorta is noted.  IMPRESSION: 3.1 cm right submandibular abscess is noted.  2.7 cm solid nodule arises inferiorly from the left thyroid lobe. Thyroid ultrasound is recommended for further evaluation.  Probable extensive carotid artery atherosclerosis. Carotid ultrasound is recommended for further evaluation.   Electronically Signed   By: Lupita Raider, M.D.   On: 01/01/2015 15:53    EKG: Orders placed or performed during the hospital encounter of 12/17/14  . EKG 12-Lead  . EKG 12-Lead  . ED EKG within 10 minutes  . ED EKG within 10 minutes  . EKG    IMPRESSION AND PLAN:  patient is a 81 year old with multiple medical problems presents with right-sided facial abscess  1. Right-sided facial abscess: At this time place her on Unasyn and vancomycin. ENT will come drain the abscess.  2. Hypertension continue amlodipine, ARB and atenolol  3. Alzheimer's dementia continue Aricept as taking  at home, as well as Seroquel  4. Hyperlipidemia continue pravastatin   5. Miscellaneous we will do heparin for DVT prophylaxis All the records are reviewed and case discussed with ED provider. Management plans discussed with the patient, family and they are in agreement.  CODE STATUS: full     Code Status Orders        Start     Ordered   01/01/15 1638  Full code   Continuous     01/01/15 1638       TOTAL TIME TAKING CARE OF this patient       55 minutes.    Auburn Bilberry M.D on 01/01/2015 at 5:01 PM  Between 7am to 6pm - Pager - [phone]  After 6pm go to [email or web address] - password [email or web address] [facility]  [facility]  Office  [phone]  CC: Primary care physician; PROVIDER NOT IN SYSTEM

## 2015-01-01 NOTE — Consult Note (Signed)
Cynthia Glenn, Smartt [id number] 05/30/1920 No att. providers found  Reason for Consult: Evaluate right neck abscess  HPI: This is a 81 year old white female who is a nursing home patient with dementia. She has multiple other medical issues. She had swelling in her neck earlier in the month and was on antibiotics for a while. She had a small area that drained spontaneous. Her neck was improving until this morning she was noted to have another read swollen area that popped up that was very tender. She is brought to the emergency room today and a CT scan showed a abscess in the right superficial skin of the neck. Assessments made today of this wound.  Allergies:  Allergies  Allergen Reactions  . Lisinopril Cough  . Statins Hives  . Ciprofloxacin     ROS: Review of systems normal other than 12 systems except per HPI.  PMH:  Past Medical History  Diagnosis Date  . Arthritis   . Hypertension   . Dementia   . COPD (chronic obstructive pulmonary disease) (HCC)   . Shingles   . Seasonal allergies   . Polycystic kidney disease   . Vitamin B12 deficiency   . Dementia   . PVD (peripheral vascular disease) (HCC)   . Vitamin D deficiency   . Essential tremor   . CVA (cerebral infarction)   . GERD (gastroesophageal reflux disease)   . HTN (hypertension)   . Hyperlipemia     FH:  Family History  Problem Relation Age of Onset  . Hypertension      SH:  Social History   Social History  . Marital Status: Widowed    Spouse Name: N/A  . Number of Children: N/A  . Years of Education: N/A   Occupational History  . Not on file.   Social History Main Topics  . Smoking status: Never Smoker   . Smokeless tobacco: Not on file  . Alcohol Use: No  . Drug Use: No  . Sexual Activity: No   Other Topics Concern  . Not on file   Social History Narrative    PSH:  Past Surgical History  Procedure Laterality Date  . Hysterotomy    . Hernia repair    . Orif ankle fracture    . Cataract  extraction    . Colon surgery      Physical  Exam: Well-developed and well-nourished white female who can communicate fairly well. CN 2-12 grossly intact and symmetric, although she is somewhat hard of hearing. Oral cavity, lips, gums, ororpharynx normal with no masses or lesions. Skin warm and dry. Nasal cavity without polyps or purulence. External nose and ears without masses or lesions. EOMI, PERRLA. Neck supple with a red swollen area the right anterior upper neck. This is quite tender to the touch. Just above it is a second little area that has scab that was of previously drained abscess.. No lymphadenopathy palpated.  CT scan was evaluated that shows phlegmon in the superficial layers the skin outside of the submandibular triangle. No other neck abnormalities are noted on the scan. She does have a 2 cm left thyroid nodule in the posterior inferior pole that is not palpable.   A/P: She has a localized skin infection of the anterior neck. This is likely staph aureus infection that has localized into a small abscess. She should be on anti-staph medication and we'll plan to do and I and D under local anesthesia. Have discussed this at length with the patient and her daughter who  was at the bedside and they wished to proceed.   Adilson Grafton H 01/01/2015 6:03 PM

## 2015-01-01 NOTE — ED Provider Notes (Signed)
[facility] Emergency Department Provider Note ____________________________________________  Time seen: 1325  I have reviewed the triage vital signs and the nursing notes.  HISTORY  Chief Complaint  Abscess  HPI Cynthia Glenn is a 81 y.o. female reports to the ED from the [place] of [address], accompanied by her adult daughter.She is presenting for evaluation and management of an abscess to the chin and mandible on the right side. She's been treated over the last 2 weeks by the clinician at the [facility], with two consecutive courses of antibiotics. She was initially on Bactrim DS 1 tab BID for 7 days. She was then switched to Augmentin 875 one tab BID for 7 days. Her daughter reports spontaneous drainage of the small abscess on the right side of her chin. Since that time she's developed however a very large, pointing, deep, and firm abscess to the mandible on the right side. She denies any interim fever, chills, or sweats. She rates her pain at a 3/10 in triage.  Past Medical History  Diagnosis Date  . Arthritis   . Hypertension   . Dementia   . COPD (chronic obstructive pulmonary disease) (HCC)   . Shingles     There are no active problems to display for this patient.   History reviewed. No pertinent past surgical history.  Current Outpatient Rx  Name  Route  Sig  Dispense  Refill  . albuterol (PROVENTIL) (2.5 MG/3ML) 0.083% nebulizer solution   Nebulization   Take 2.5 mg by nebulization every 6 (six) hours as needed for wheezing or shortness of breath.         Marland Kitchen amLODipine (NORVASC) 10 MG tablet   Oral   Take 10 mg by mouth daily.         Marland Kitchen amoxicillin-clavulanate (AUGMENTIN) 875-125 MG tablet   Oral   Take 1 tablet by mouth 2 (two) times daily.         Marland Kitchen aspirin 81 MG tablet   Oral   Take 81 mg by mouth daily.         Marland Kitchen atenolol (TENORMIN) 100 MG tablet   Oral   Take 100 mg by mouth daily.         . benzonatate (TESSALON) 100 MG  capsule   Oral   Take by mouth 3 (three) times daily as needed for cough.         . busPIRone (BUSPAR) 7.5 MG tablet   Oral   Take 7.5 mg by mouth 3 (three) times daily.         . calcium-vitamin D (OSCAL WITH D) 500-200 MG-UNIT tablet   Oral   Take 1 tablet by mouth.         . donepezil (ARICEPT) 10 MG tablet   Oral   Take 10 mg by mouth at bedtime.         . fluticasone (FLONASE) 50 MCG/ACT nasal spray   Each Nare   Place into both nostrils daily.         Marland Kitchen HYDROcodone-acetaminophen (NORCO/VICODIN) 5-325 MG tablet   Oral   Take 1 tablet by mouth every 6 (six) hours as needed for moderate pain.         Marland Kitchen lactobacillus acidophilus (BACID) TABS tablet   Oral   Take 2 tablets by mouth 3 (three) times daily.         . multivitamin-lutein (OCUVITE-LUTEIN) CAPS capsule   Oral   Take 1 capsule by mouth daily.         Marland Kitchen  ondansetron (ZOFRAN) 4 MG tablet   Oral   Take 4 mg by mouth every 8 (eight) hours as needed for nausea or vomiting.         . pravastatin (PRAVACHOL) 20 MG tablet   Oral   Take 20 mg by mouth daily.         . QUEtiapine (SEROQUEL) 100 MG tablet   Oral   Take 150 mg by mouth at bedtime.         . sulfamethoxazole-trimethoprim (BACTRIM DS,SEPTRA DS) 800-160 MG tablet   Oral   Take 1 tablet by mouth 2 (two) times daily.         . valsartan (DIOVAN) 320 MG tablet   Oral   Take 320 mg by mouth daily.         . vitamin B-12 (CYANOCOBALAMIN) 500 MCG tablet   Oral   Take 500 mcg by mouth daily.         . cephALEXin (KEFLEX) 500 MG capsule   Oral   Take 1 capsule (500 mg total) by mouth 4 (four) times daily.   40 capsule   0   . docusate sodium (COLACE) 100 MG capsule   Oral   Take 1 capsule (100 mg total) by mouth daily as needed.   30 capsule   2   . oxyCODONE-acetaminophen (ROXICET) 5-325 MG per tablet   Oral   Take 1 tablet by mouth every 6 (six) hours as needed.   20 tablet   0    Allergies Lisinopril;  Statins; and Ciprofloxacin  History reviewed. No pertinent family history.  Social History Social History  Substance Use Topics  . Smoking status: Never Smoker   . Smokeless tobacco: None  . Alcohol Use: No   Review of Systems  Constitutional: Negative for fever. Eyes: Negative for visual changes. ENT: Negative for sore throat. Cardiovascular: Negative for chest pain. Respiratory: Negative for shortness of breath. Gastrointestinal: Negative for abdominal pain, vomiting and diarrhea. Genitourinary: Negative for dysuria. Musculoskeletal: Negative for back pain. Skin: Negative for rash. Large abscess as above.  Neurological: Negative for headaches, focal weakness or numbness. ____________________________________________  PHYSICAL EXAM:  VITAL SIGNS: ED Triage Vitals  Enc Vitals Group     BP 01/01/15 1144 152/53 mmHg     Pulse Rate 01/01/15 1144 79     Resp 01/01/15 1144 18     Temp 01/01/15 1144 97.7 F (36.5 C)     Temp Source 01/01/15 1144 Oral     SpO2 01/01/15 1144 96 %     Weight 01/01/15 1144 145 lb (65.772 kg)     Height 01/01/15 1144  (1.676 m)     Head Cir --      Peak Flow --      Pain Score 01/01/15 1144 3     Pain Loc --      Pain Edu? --      Excl. in GC? --    Constitutional: Alert and oriented. Well appearing and in no distress. Eyes: Conjunctivae are normal. PERRL. Normal extraocular movements. ENT   Head: Normocephalic and atraumatic.   Nose: No congestion/rhinorrhea.   Mouth/Throat: Mucous membranes are moist.   Neck: Supple. No thyromegaly. Hematological/Lymphatic/Immunological: No cervical lymphadenopathy. Cardiovascular: Normal rate, regular rhythm.  Respiratory: Normal respiratory effort. No wheezes/rales/rhonchi. Gastrointestinal: Soft and nontender. No distention. Musculoskeletal: Nontender with normal range of motion in all extremities.  Neurologic:  Normal gait without ataxia. Normal speech and language. No gross focal  neurologic  deficits are appreciated. Skin:  Skin is warm, dry and intact. No rash noted. Patient with a firm area of erythema and induration to the right side of the chin. Is noted to have a small yellow scab superficially. Additionally there is a very large pointing abscess with deep palpable induration well into the sublingual space on the right side of the mandible. Psychiatric: Mood and affect are normal. Patient exhibits appropriate insight and judgment. ____________________________________________    LABS (pertinent positives/negatives) Labs Reviewed  CBC WITH DIFFERENTIAL/PLATELET - Abnormal; Notable for the following:    Neutro Abs 6.9 (*)    Lymphs Abs 0.8 (*)    All other components within normal limits  BASIC METABOLIC PANEL - Abnormal; Notable for the following:    Creatinine, Ser 1.04 (*)    GFR calc non Af Amer 45 (*)    GFR calc Af Amer 52 (*)    All other components within normal limits  SEDIMENTATION RATE - Abnormal; Notable for the following:    Sed Rate 42 (*)    All other components within normal limits  URINALYSIS COMPLETEWITH MICROSCOPIC ([facility] ONLY) - Abnormal; Notable for the following:    Color, Urine STRAW (*)    APPearance CLEAR (*)    Squamous Epithelial / LPF 0-5 (*)    All other components within normal limits  ____________________________________________   RADIOLOGY  CT Soft Tissue Neck  IMPRESSION: 3.1 cm right submandibular abscess is noted.  2.7 cm solid nodule arises inferiorly from the left thyroid lobe. Thyroid ultrasound is recommended for further evaluation.  Probable extensive carotid artery atherosclerosis. Carotid ultrasound is recommended for further evaluation.  I, Jaquaveon Bilal, Charlesetta Ivory, personally viewed and evaluated these images (plain radiographs) as part of my medical decision making.  ____________________________________________  PROCEDURES  IV Saline lock ____________________________________________  INITIAL  IMPRESSION / ASSESSMENT AND PLAN / ED COURSE  ----------------------------------------- 4:19 PM on 01/01/2015 ----------------------------------------- Spoke with Dr. Elenore Rota regarding patient and CT results. He suggest admission to the hospitalist service. Patient and family agree to treatment plan.  Patient with an acute submandibular abscess the right side, and an occult left thyroid nodule on CT scan. Patient to be admitted to the hospital service, and Dr. Elenore Rota will aspirate the abscess on the inpatient side. ____________________________________________  FINAL CLINICAL IMPRESSION(S) / ED DIAGNOSES  Final diagnoses:  Cellulitis and abscess of neck  Submandibular abscess      Lissa Hoard, PA-C 01/01/15 1640  Arnaldo Natal, MD 01/02/15 1640

## 2015-01-01 NOTE — ED Notes (Signed)
Reports "bump" on chin.  Reports green drainage.

## 2015-01-01 NOTE — Op Note (Signed)
01/01/2015  7:10 PM    Cynthia Glenn, [place]  [id number]   Pre-Op Dx:  Right anterior neck skin abscess  Post-op Dx: Same  Proc: Incision and drainage of right anterior neck skin abscess   Surg:  Balin Vandegrift H  Anes:  GOT  EBL:  Minimal  Comp:  None  Findings:  Lots of cellulitis with some thick purulent drainage  Procedure: Patient was seen at the bedside. After informed surgical request was signed the area was prepped with Betadine. Approximately 1 mL of lidocaine 1% with epi 1:100,000 was used for infiltration at the area of inflammation that was pointing. The skin blanched to show the area of infiltration. After this was allowed to sit for 10 minutes an incision was made through the middle of this to open up the anterior neck abscess. Culture was taken of the pus as it exited the wound. The wound was widened and opened and had a depth of approximately 2-1/2 cm. Digital pressure was used to help express all of the purulence from the wound. A small wick was placed in the wound with Betadine gauze. A bandage was placed over this.  The patient tolerated the procedure well.  Dispo:   She is on IV antibiotics and will likely be in the hospital 48 hours.  Plan:  We will plan to see her tomorrow and remove the wick. May have to replace it if she continues to show evidence of cellulitis and hip abscess  [name]  01/01/2015 7:10 PM

## 2015-01-02 LAB — BASIC METABOLIC PANEL
ANION GAP: 5 (ref 5–15)
BUN: 11 mg/dL (ref 6–20)
CALCIUM: 9 mg/dL (ref 8.9–10.3)
CO2: 29 mmol/L (ref 22–32)
CREATININE: 0.84 mg/dL (ref 0.44–1.00)
Chloride: 107 mmol/L (ref 101–111)
GFR calc Af Amer: >60 mL/min (ref >60)
GFR, EST NON AFRICAN AMERICAN: 59 mL/min — AB (ref >60)
GLUCOSE: 94 mg/dL (ref 65–99)
Potassium: 4 mmol/L (ref 3.5–5.1)
Sodium: 141 mmol/L (ref 135–145)

## 2015-01-02 LAB — CBC
HCT: 37.8 % (ref 35.0–47.0)
Hemoglobin: 12.4 g/dL (ref 12.0–16.0)
MCH: 29.1 pg (ref 26.0–34.0)
MCHC: 32.8 g/dL (ref 32.0–36.0)
MCV: 88.8 fL (ref 80.0–100.0)
PLATELETS: 244 10*3/uL (ref 150–440)
RBC: 4.25 MIL/uL (ref 3.80–5.20)
RDW: 14.3 % (ref 11.5–14.5)
WBC: 5.1 10*3/uL (ref 3.6–11.0)

## 2015-01-02 MED ORDER — VANCOMYCIN HCL IN DEXTROSE 1-5 GM/200ML-% IV SOLN
1000.0000 mg | INTRAVENOUS | Status: DC
  Administered 2015-01-02 – 2015-01-03 (×2): 1000 mg via INTRAVENOUS
  Filled 2015-01-02 (×2): qty 200

## 2015-01-02 MED ORDER — SODIUM CHLORIDE 0.9 % IV SOLN
1.5000 g | Freq: Four times a day (QID) | INTRAVENOUS | Status: DC
  Administered 2015-01-02 – 2015-01-03 (×5): 1.5 g via INTRAVENOUS
  Filled 2015-01-02 (×7): qty 1.5

## 2015-01-02 NOTE — Clinical Social Work Note (Signed)
Clinical Social Work Assessment  Patient Details  Name: Cynthia Glenn MRN: [id number] Date of Birth: 1920/05/26  Date of referral:  01/02/15               Reason for consult:   (from [place] [facility] [place]                Permission sought to share information with:  Facility Medical sales representative, Family Supports (son Brett Canales ) Permission granted to share information::  No  Name::        Agency::     Relationship::     Contact Information:     Housing/Transportation Living arrangements for the past 2 months:  Assisted Living Facility Source of Information:  Adult Children Patient Interpreter Needed:  None Criminal Activity/Legal Involvement Pertinent to Current Situation/Hospitalization:  No - Comment as needed Significant Relationships:  Adult Children Lives with:  Facility Resident Do you feel safe going back to the place where you live?  Yes (son feels safe sending patient back to ALF) Need for family participation in patient care:     Care giving concerns:  None at this time   Office manager / plan:  Clinical Social Worker in to assess patient for ongoing and disposition needs. Patient is not engaged in conversation with CSW.  (Information provided by patient's son Brett Canales).   Patient currently lives in ALF at the [facility] [address] has lived there over 7 years .  Patient's support system is her son and daughter.  While at [facility] patient mostly ambulates with a walker.       Patient and support system would like patient to return to [facility] [place] at discharge. Son or daughter will transport patient back to the [place].   CSW will complete FL2, place copy on chart in anticipation of patient returning to her ALF.  Employment status:    Health and safety inspector:    PT Recommendations:  Not assessed at this time Information / Referral to community resources:   (none at this time)  Patient/Family's Response to care:  Family is in agreement with  plan  Patient/Family's Understanding of and Emotional Response to Diagnosis, Current Treatment, and Prognosis:  Per son he understands patient will remain in the hopsital for continued medical work up and anticipate that patient will discharge back to ALF within the next few days.  Son or daughter will transport patient at discharge   Emotional Assessment Appearance:    Attitude/Demeanor/Rapport:  Unable to Assess Affect (typically observed):  Pleasant (confused) Orientation:  Oriented to Self Alcohol / Substance use:  Never Used Psych involvement (Current and /or in the community):  No (Comment)  Discharge Needs  Concerns to be addressed:  No discharge needs identified Readmission within the last 30 days:  No Current discharge risk:  Chronically ill, Dependent with Mobility Barriers to Discharge:  Continued Medical Work up, No Barriers Identified   Soundra Pilon, LCSW 01/02/2015, 11:26 AM Sammuel Hines. Theresia Majors, MSW Clinical Social Work Department Emergency Room [phone] 11:31 AM

## 2015-01-02 NOTE — Progress Notes (Signed)
Cynthia Murders MD 01/02/2015 11 am  S: Feeling better, much less pain in neck.  O: Swelling down significantly. [name] already pulled out. Dressing recently changed. Still with small amount of drainage, but cellulitis area much less.  Await micro report. A: Much improved.  P: Once micro report confirms bacteria, can switch to orals and send back to nursing home. Doesn't need ENT follow up unless there is challenges with wound healing. Continue dressing changes a couple times a day.

## 2015-01-02 NOTE — Plan of Care (Signed)
Problem: Discharge Progression Outcomes Goal: Other Discharge Outcomes/Goals Outcome: Progressing Plan of care progress to goals:  Discharge plan- pt is from [facility] and would like to return there at time of discharge.  Pain- pt c/o right face/neck pain at incision, improved with PRN pain medication.  Hemodynamically stable- WBC 8.6, pt started on Vancomycin IV, afebrile this shift, VSS.  Diet- tolerating, no c/o nausea or vomiting this shift. Activity appropriate for discharge- pt states that she ambulates with a walker at [employer]. Pt is able to get OOB up to [facility] with 1 assist. Per family this is her baseline just extremely weak currently.

## 2015-01-02 NOTE — Plan of Care (Signed)
Problem: Discharge Progression Outcomes Goal: Other Discharge Outcomes/Goals Outcome: Progressing Patient continues to get iv antibiotics for abscess on face, Patient alert disoriented to place and situation, one assist to bedside commode, vss bed alarm on Patient will get up on her own without calling.

## 2015-01-02 NOTE — Progress Notes (Signed)
[facility] [place] [facility]   PATIENT NAME: Cynthia Glenn    MR#:  [id number]  DATE OF BIRTH:  1921-01-31  SUBJECTIVE:  CHIEF COMPLAINT:   Chief Complaint  Patient presents with  . Abscess   Doing well no new complaints. Eating lunch  REVIEW OF SYSTEMS:   Review of Systems  Constitutional: Negative for fever.  Respiratory: Negative for shortness of breath.   Cardiovascular: Negative for chest pain and palpitations.  Gastrointestinal: Negative for nausea, vomiting and abdominal pain.  Genitourinary: Negative for dysuria.    DRUG ALLERGIES:   Allergies  Allergen Reactions  . Lisinopril Cough  . Statins Hives  . Ciprofloxacin     VITALS:  Blood pressure 172/54, pulse 68, temperature 97.7 F (36.5 C), temperature source Oral, resp. rate 18, height  (1.651 m), weight 65.817 kg (145 lb 1.6 oz), SpO2 100 %.  PHYSICAL EXAMINATION:  GENERAL:  81 y.o.-year-old patient sitting up, eating lunch LUNGS: Normal breath sounds bilaterally, no wheezing, rales,rhonchi or crepitation. No use of accessory muscles of respiration.  CARDIOVASCULAR: S1, S2 normal. No murmurs, rubs, or gallops.  ABDOMEN: Soft, nontender, nondistended. Bowel sounds present. No organomegaly or mass.  EXTREMITIES: No pedal edema, cyanosis, or clubbing.  NEUROLOGIC: Cranial nerves II through XII are intact. Muscle strength 5/5 in all extremities. Sensation intact. Gait not checked.  PSYCHIATRIC: The patient is alert and oriented x 3. Calm SKIN: Large area of induration and erythema under the right mandible which is very tender to touch, dressed, thick purulent-looking exudate   LABORATORY PANEL:   CBC  Recent Labs Lab 01/02/15 0628  WBC 5.1  HGB 12.4  HCT 37.8  PLT 244   ------------------------------------------------------------------------------------------------------------------  Chemistries   Recent Labs Lab 01/02/15 0628  NA 141  K 4.0  CL 107   CO2 29  GLUCOSE 94  BUN 11  CREATININE 0.84  CALCIUM 9.0   ------------------------------------------------------------------------------------------------------------------  Cardiac Enzymes No results for input(s): TROPONINI in the last 168 hours. ------------------------------------------------------------------------------------------------------------------  RADIOLOGY:  Ct Soft Tissue Neck W Contrast  01/01/2015   CLINICAL DATA:  Right submandibular abscess.  EXAM: CT NECK WITH CONTRAST  TECHNIQUE: Multidetector CT imaging of the neck was performed using the standard protocol following the bolus administration of intravenous contrast.  CONTRAST:  60mL OMNIPAQUE IOHEXOL 300 MG/ML  SOLN  COMPARISON:  None.  FINDINGS: Pharynx and larynx: No definite abnormality is noted. Larynx appears normal. Epiglottis appears normal.  Salivary glands: Parotid and submandibular glands appear normal. Irregular fluid collection measuring 3.1 x 2.5 x 1.3 cm is seen in the superficial portion the right mandibular region consistent with abscess.  Thyroid: 2.7 x 1.5 cm solid mass is seen arising inferiorly from the left thyroid lobe. Thyroid ultrasound is recommended for further evaluation.  Lymph nodes: No significantly enlarged adenopathy is noted.  Vascular: Extensive calcifications are seen involving the carotid bulb and proximal right internal carotid arteries suggesting carotid artery atherosclerosis.  Limited intracranial: No definite abnormality seen.  Visualized orbits: Visualized portions appear normal.  Mastoids and visualized paranasal sinuses: Normal.  Skeleton: Degenerative disc disease is noted at C4-5, C5-6 and C6-7.  Upper chest: Visualized lung parenchyma appears normal. Atherosclerosis of thoracic aorta is noted.  IMPRESSION: 3.1 cm right submandibular abscess is noted.  2.7 cm solid nodule arises inferiorly from the left thyroid lobe. Thyroid ultrasound is recommended for further evaluation.   Probable extensive carotid artery atherosclerosis. Carotid ultrasound is recommended for further evaluation.   Electronically Signed  By: Lupita Raider, M.D.   On: 01/01/2015 15:53    EKG:   Orders placed or performed during the hospital encounter of 12/17/14  . EKG 12-Lead  . EKG 12-Lead  . ED EKG within 10 minutes  . ED EKG within 10 minutes  . EKG    ASSESSMENT AND PLAN:   #1 submandibular abscess with cellulitis: Appreciate ENT consultation. She has had I&D of this abscess with good results. Awaiting culture results. For now continue with vancomycin. No systemic symptoms of infection. Pain is controlled.  #2 hypertension: Blood pressure elevated at this time. Continue home medications. Continue to monitor.  #3 Alzheimer's dementia: Stable without behavioral disturbance at this time.  CODE STATUS: Full   TOTAL TIME TAKING CARE OF THIS PATIENT: 20 minutes.  Greater than 50% of time spent in care coordination and counseling. POSSIBLE D/C IN 1 DAYS, DEPENDING ON CLINICAL CONDITION.   Elby Showers M.D on 01/02/2015 at 1:02 PM  Between 7am to 6pm - Pager - [phone]  After 6pm go to [email or web address] - password [email or web address] [facility]  [facility]  Office  [phone]  CC: Primary care physician; PROVIDER NOT IN SYSTEM

## 2015-01-02 NOTE — Consult Note (Signed)
ANTIBIOTIC CONSULT NOTE - INITIAL  Pharmacy Consult for vancomycin/amp/sul Indication: cellulitis/abcess  Allergies  Allergen Reactions  . Lisinopril Cough  . Statins Hives  . Ciprofloxacin     Patient Measurements: Height:  (165.1 cm) Weight: 145 lb 1.6 oz (65.817 kg) IBW/kg (Calculated) : 57   Vital Signs: Temp: 97.7 F (36.5 C) (10/09 0525) Temp Source: Oral (10/09 0525) BP: 178/50 mmHg (10/09 0715) Pulse Rate: 64 (10/09 0715) Intake/Output from previous day: 10/08 0701 - 10/09 0700 In: 970 [I.V.:970] Out: 400 [Urine:400] Intake/Output from this shift: Total I/O In: -  Out: 300 [Urine:300]  Labs:  Recent Labs  01/01/15 1430 01/02/15 0628  WBC 8.6 5.1  HGB 13.3 12.4  PLT 269 244  CREATININE 1.04* 0.84   Estimated Creatinine Clearance: 38.5 mL/min (by C-G formula based on Cr of 0.84). No results for input(s): VANCOTROUGH, VANCOPEAK, VANCORANDOM, GENTTROUGH, GENTPEAK, GENTRANDOM, TOBRATROUGH, TOBRAPEAK, TOBRARND, AMIKACINPEAK, AMIKACINTROU, AMIKACIN in the last 72 hours.   Microbiology: No results found for this or any previous visit (from the past 720 hour(s)).  Medical History: Past Medical History  Diagnosis Date  . Arthritis   . Hypertension   . Dementia   . COPD (chronic obstructive pulmonary disease) (HCC)   . Shingles   . Seasonal allergies   . Polycystic kidney disease   . Vitamin B12 deficiency   . Dementia   . PVD (peripheral vascular disease) (HCC)   . Vitamin D deficiency   . Essential tremor   . CVA (cerebral infarction)   . GERD (gastroesophageal reflux disease)   . HTN (hypertension)   . Hyperlipemia     Medications:  Scheduled:  . amLODipine  10 mg Oral Daily  . ampicillin-sulbactam (UNASYN) IV  1.5 g Intravenous Q6H  . aspirin EC  81 mg Oral Daily  . atenolol  100 mg Oral Daily  . busPIRone  7.5 mg Oral TID  . calcium-vitamin D  1 tablet Oral BID  . donepezil  10 mg Oral QHS  . fluticasone  1 spray Each Nare Daily   . heparin  5,000 Units Subcutaneous 3 times per day  . irbesartan  37.5 mg Oral Daily  . lactobacillus acidophilus  2 tablet Oral TID  . multivitamin-lutein  1 capsule Oral Daily  . pravastatin  20 mg Oral Daily  . QUEtiapine  150 mg Oral QHS  . vancomycin  1,000 mg Intravenous Q24H  . vitamin B-12  500 mcg Oral Daily   Assessment: Pt is a 81 year old female who presents with an abscess/cellutlis. Pharmacy consulted to dose vancomycin and amp/sulbactam. Patient was on bactrim and keflex at her facility but swelling continued to worsen Ke=0.038 hr -1 Vd=46 L T1/2=19 hr  Goal of Therapy:  Vancomycin trough: 15-20 mcg/ml  Plan:  Unasyn not ordered so will order Unasyn 1.5 g iv q 6 hours.   Will increase vancomycin to 1000 mg iv q 24 hours to target trough of 15-20 mcg/ml for abscess and plan on checking a trough with the 4th dose.    Cynthia Glenn D 01/02/2015,10:17 AM

## 2015-01-03 DIAGNOSIS — L03221 Cellulitis of neck: Secondary | ICD-10-CM

## 2015-01-03 DIAGNOSIS — L0211 Cutaneous abscess of neck: Secondary | ICD-10-CM

## 2015-01-03 DIAGNOSIS — K122 Cellulitis and abscess of mouth: Secondary | ICD-10-CM

## 2015-01-03 LAB — CBC
HCT: 36.7 % (ref 35.0–47.0)
HEMOGLOBIN: 12.5 g/dL (ref 12.0–16.0)
MCH: 30.1 pg (ref 26.0–34.0)
MCHC: 34 g/dL (ref 32.0–36.0)
MCV: 88.8 fL (ref 80.0–100.0)
PLATELETS: 245 10*3/uL (ref 150–440)
RBC: 4.14 MIL/uL (ref 3.80–5.20)
RDW: 14.4 % (ref 11.5–14.5)
WBC: 4.8 10*3/uL (ref 3.6–11.0)

## 2015-01-03 MED ORDER — SULFAMETHOXAZOLE-TRIMETHOPRIM 400-80 MG PO TABS: 1.0000 | ORAL_TABLET | Freq: Two times a day (BID) | ORAL | Status: DC

## 2015-01-03 MED ORDER — SULFAMETHOXAZOLE-TRIMETHOPRIM 800-160 MG PO TABS: 1.0000 | ORAL_TABLET | Freq: Two times a day (BID) | ORAL | Status: DC

## 2015-01-03 NOTE — Progress Notes (Signed)
IV removed per policy and pt transported via car by daughter to [facility].  Orson Ape, RN

## 2015-01-03 NOTE — Progress Notes (Signed)
ANTIBIOTIC CONSULT NOTE - FOLLOW UP  Pharmacy Consult for Vancomycin/Unasyn Indication: Cellulitis/abscess of face  Allergies  Allergen Reactions  . Lisinopril Cough  . Statins Hives  . Ciprofloxacin     Patient Measurements: Height:  (165.1 cm) Weight: 145 lb 1.6 oz (65.817 kg) IBW/kg (Calculated) : 57   Vital Signs: Temp: 98.7 F (37.1 C) (10/10 0841) Temp Source: Oral (10/10 0522) BP: 172/57 mmHg (10/10 0841) Pulse Rate: 55 (10/10 0841) Intake/Output from previous day: 10/09 0701 - 10/10 0700 In: 50 [IV Piggyback:50] Out: 1150 [Urine:1150] Intake/Output from this shift: Total I/O In: -  Out: 600 [Urine:600]  Labs:  Recent Labs  01/01/15 1430 01/02/15 0628 01/03/15 0441  WBC 8.6 5.1 4.8  HGB 13.3 12.4 12.5  PLT 269 244 245  CREATININE 1.04* 0.84  --    Estimated Creatinine Clearance: 38.5 mL/min (by C-G formula based on Cr of 0.84). No results for input(s): VANCOTROUGH, VANCOPEAK, VANCORANDOM, GENTTROUGH, GENTPEAK, GENTRANDOM, TOBRATROUGH, TOBRAPEAK, TOBRARND, AMIKACINPEAK, AMIKACINTROU, AMIKACIN in the last 72 hours.   Microbiology: Recent Results (from the past 720 hour(s))  Wound culture     Status: None (Preliminary result)   Collection Time: 01/01/15  7:14 PM  Result Value Ref Range Status   Specimen Description ABSCESS  Final   Special Requests NONE  Final   Gram Stain PENDING  Incomplete   Culture   Final    MODERATE GROWTH STAPHYLOCOCCUS AUREUS SUSCEPTIBILITIES TO FOLLOW    Report Status PENDING  Incomplete    Anti-infectives    Start     Dose/Rate Route Frequency Ordered Stop   01/02/15 1830  vancomycin (VANCOCIN) IVPB 750 mg/150 ml premix  Status:  Discontinued     750 mg 150 mL/hr over 60 Minutes Intravenous Every 24 hours 01/01/15 1720 01/02/15 1016   01/02/15 1100  vancomycin (VANCOCIN) IVPB 1000 mg/200 mL premix     1,000 mg 200 mL/hr over 60 Minutes Intravenous Every 24 hours 01/02/15 1016     01/02/15 1030   ampicillin-sulbactam (UNASYN) 1.5 g in sodium chloride 0.9 % 50 mL IVPB     1.5 g 100 mL/hr over 30 Minutes Intravenous Every 6 hours 01/02/15 1016     01/01/15 1730  vancomycin (VANCOCIN) 1,250 mg in sodium chloride 0.9 % 250 mL IVPB     1,250 mg 166.7 mL/hr over 90 Minutes Intravenous STAT 01/01/15 1720 01/01/15 2051      Assessment: 81 yo female hospitalized for face abscess and cellulitis. Patient failed treatment with Bactrim and Keflex prior to hospital admission. Pharmacy consulted for Unasyn and vancomycin dosing and monitoring. Patient currently receiving vancomycin 1g q24 hours and Unasyn 1.5g q6 hours. Cultures now growing S.aureus with susceptibilites pending.   Goal of Therapy:  Vancomycin trough level 15-20 mcg/ml  Plan:  Will continue current dosing regimen of vancomycin 1g q24 hours and Unasyn 1.5g q6 hours. Scr ordered for 0500 on 10/11. Pharmacy will continue to monitor labs and renal function and make adjustments as needed. Will recommend discontinuing Unasyn during rounds.   Cher Nakai, PharmD Pharmacy Resident

## 2015-01-03 NOTE — Plan of Care (Signed)
Problem: Discharge Progression Outcomes Goal: Other Discharge Outcomes/Goals Outcome: Progressing Plan of care progress to goals:   Discharge plan- awaiting results from micro to confirm bacteria, will switch to PO antibiotics and pt may then return to [facility]  Pain- pt c/o right face/neck pain at incision once this shift, improved with PRN pain medication.   Hemodynamically stable- Improved WBC 5.1, IV antibiotic infused per MD order, afebrile this shift, VSS.   Complications- pt refused Heparin injection, education provided concerning medication. Pt states that she would like to speak to her doctor about it.  Diet- tolerating, no c/o nausea or vomiting this shift. Activity appropriate for discharge-  Pt is able to get OOB up to [facility] with 1 assist. Weakness improving.

## 2015-01-03 NOTE — Discharge Summary (Signed)
[facility] - [place] at [facility]  DISCHARGE SUMMARY   PATIENT NAME: Cynthia Glenn    MR#:  [id number]  DATE OF BIRTH:  1920-05-17  DATE OF ADMISSION:  01/01/2015 ADMITTING PHYSICIAN: Auburn Bilberry, MD  DATE OF DISCHARGE: 01/03/2015  PRIMARY CARE PHYSICIAN: PROVIDER NOT IN SYSTEM    ADMISSION DIAGNOSIS:  Cellulitis and abscess of neck [L03.221, L02.11] Submandibular abscess [K12.2]  DISCHARGE DIAGNOSIS:  Active Problems:   Cellulitis and abscess of neck   Submandibular abscess   SECONDARY DIAGNOSIS:   Past Medical History  Diagnosis Date  . Arthritis   . Hypertension   . Dementia   . COPD (chronic obstructive pulmonary disease) (HCC)   . Shingles   . Seasonal allergies   . Polycystic kidney disease   . Vitamin B12 deficiency   . Dementia   . PVD (peripheral vascular disease) (HCC)   . Vitamin D deficiency   . Essential tremor   . CVA (cerebral infarction)   . GERD (gastroesophageal reflux disease)   . HTN (hypertension)   . Hyperlipemia     HOSPITAL COURSE:    #1 submandibular abscess with cellulitis: Appreciate ENT consultation. She has had I&D of this abscess on 10/8 with good results. Pain is controlled. Has had 3 days of vancomycin and unasyn. Culture shows MRSA sensitive to Bactrim. Will write for Bactrim DS BID x 14 days. She should be seen by Primary MD in 2-3 days to assess wound healing.  #2 hypertension: Blood pressure elevated at this time. Continue home medications. Continue to monitor, may need adjustment of antihypertensives.  #3 Alzheimer's dementia: Stable without behavioral disturbance at this time.   DISCHARGE CONDITIONS:   stable  CONSULTS OBTAINED:  Treatment Team:  Auburn Bilberry, MD  DRUG ALLERGIES:   Allergies  Allergen Reactions  . Lisinopril Cough  . Statins Hives  . Ciprofloxacin     DISCHARGE MEDICATIONS:   Current Discharge Medication List    CONTINUE these medications which have  CHANGED   Details  sulfamethoxazole-trimethoprim (BACTRIM DS,SEPTRA DS) 800-160 MG tablet Take 1 tablet by mouth 2 (two) times daily. Qty: 28 tablet, Refills: 0      CONTINUE these medications which have NOT CHANGED   Details  albuterol (PROVENTIL) (2.5 MG/3ML) 0.083% nebulizer solution Take 2.5 mg by nebulization every 6 (six) hours as needed for wheezing or shortness of breath.    amLODipine (NORVASC) 10 MG tablet Take 10 mg by mouth daily.    aspirin 81 MG tablet Take 81 mg by mouth daily.    atenolol (TENORMIN) 100 MG tablet Take 100 mg by mouth daily.    benzonatate (TESSALON) 100 MG capsule Take by mouth 3 (three) times daily as needed for cough.    busPIRone (BUSPAR) 7.5 MG tablet Take 7.5 mg by mouth 3 (three) times daily.    calcium-vitamin D (OSCAL WITH D) 500-200 MG-UNIT tablet Take 1 tablet by mouth.    donepezil (ARICEPT) 10 MG tablet Take 10 mg by mouth at bedtime.    fluticasone (FLONASE) 50 MCG/ACT nasal spray Place into both nostrils daily.    HYDROcodone-acetaminophen (NORCO/VICODIN) 5-325 MG tablet Take 1 tablet by mouth every 6 (six) hours as needed for moderate pain.    lactobacillus acidophilus (BACID) TABS tablet Take 2 tablets by mouth 3 (three) times daily.    multivitamin-lutein (OCUVITE-LUTEIN) CAPS capsule Take 1 capsule by mouth daily.    ondansetron (ZOFRAN) 4 MG tablet Take 4 mg by mouth every 8 (eight) hours  as needed for nausea or vomiting.    pravastatin (PRAVACHOL) 20 MG tablet Take 20 mg by mouth daily.    QUEtiapine (SEROQUEL) 100 MG tablet Take 150 mg by mouth at bedtime.    valsartan (DIOVAN) 320 MG tablet Take 320 mg by mouth daily.    vitamin B-12 (CYANOCOBALAMIN) 500 MCG tablet Take 500 mcg by mouth daily.    docusate sodium (COLACE) 100 MG capsule Take 1 capsule (100 mg total) by mouth daily as needed. Qty: 30 capsule, Refills: 2    oxyCODONE-acetaminophen (ROXICET) 5-325 MG per tablet Take 1 tablet by mouth every 6 (six) hours  as needed. Qty: 20 tablet, Refills: 0      STOP taking these medications     amoxicillin-clavulanate (AUGMENTIN) 875-125 MG tablet      cephALEXin (KEFLEX) 500 MG capsule          DISCHARGE INSTRUCTIONS:    DIET:  Cardiac diet  DISCHARGE CONDITION:  Fair  ACTIVITY:  Activity as tolerated  OXYGEN:  Home Oxygen: No.   Oxygen Delivery: room air  DISCHARGE LOCATION:  nursing home   If you experience worsening of your admission symptoms, develop shortness of breath, life threatening emergency, suicidal or homicidal thoughts you must seek medical attention immediately by calling 911 or calling your MD immediately  if symptoms less severe.  You Must read complete instructions/literature along with all the possible adverse reactions/side effects for all the Medicines you take and that have been prescribed to you. Take any new Medicines after you have completely understood and accpet all the possible adverse reactions/side effects.   Please note  You were cared for by a hospitalist during your hospital stay. If you have any questions about your discharge medications or the care you received while you were in the hospital after you are discharged, you can call the unit and asked to speak with the hospitalist on call if the hospitalist that took care of you is not available. Once you are discharged, your primary care physician will handle any further medical issues. Please note that NO REFILLS for any discharge medications will be authorized once you are discharged, as it is imperative that you return to your primary care physician (or establish a relationship with a primary care physician if you do not have one) for your aftercare needs so that they can reassess your need for medications and monitor your lab values.   Today   CHIEF COMPLAINT:   Chief Complaint  Patient presents with  . Abscess    HISTORY OF PRESENT ILLNESS:  Cynthia Glenn is a 81 y.o. female with a known  history of arthritis, hypertension, dementia, COPD, shingles, history of CVA, hypertension and hyperlipidemia who currently resides in assisted living facility who is has swelling on the right chin swelling and a boil. Patient has been treated with oral anabiotic's for the past few weeks by the physician that visits her at the facility. He instructed the patient's family that if the swelling did not improve he recommends patient be brought to the emergency room. Therefore she was brought to the emergency room. In the emergency room she had a CT scan which showed a abscess. The case was discussed with ENT they will be coming later to drain the abscess. Patient is a very poor historian due to her dementia. Does complaint of right pain below her right breast. VITAL SIGNS:  Blood pressure 172/57, pulse 55, temperature 98.7 F (37.1 C), temperature source Oral, resp. rate 18, height  5\' 5"  (1.651 m), weight 65.817 kg (145 lb 1.6 oz), SpO2 98 %.  I/O:   Intake/Output Summary (Last 24 hours) at 01/03/15 1201 Last data filed at 01/03/15 1031  Gross per 24 hour  Intake     50 ml  Output   1450 ml  Net  -1400 ml    PHYSICAL EXAMINATION:  GENERAL: 81 y.o.-year-old patient sitting up, eating lunch LUNGS: Normal breath sounds bilaterally, no wheezing, rales,rhonchi or crepitation. No use of accessory muscles of respiration.  CARDIOVASCULAR: S1, S2 normal. No murmurs, rubs, or gallops.  ABDOMEN: Soft, nontender, nondistended. Bowel sounds present. No organomegaly or mass.  EXTREMITIES: No pedal edema, cyanosis, or clubbing.  NEUROLOGIC: Cranial nerves II through XII are intact. Muscle strength 5/5 in all extremities. Sensation intact. Gait not checked.  PSYCHIATRIC: The patient is alert and oriented x 3. Calm SKIN: Large area of induration and erythema under the right mandible which is improving. Indurated and tender.  DATA REVIEW:   CBC  Recent Labs Lab 01/03/15 0441  WBC 4.8  HGB 12.5   HCT 36.7  PLT 245    Chemistries   Recent Labs Lab 01/02/15 0628  NA 141  K 4.0  CL 107  CO2 29  GLUCOSE 94  BUN 11  CREATININE 0.84  CALCIUM 9.0    Cardiac Enzymes No results for input(s): TROPONINI in the last 168 hours.  Microbiology Results  Results for orders placed or performed during the hospital encounter of 01/01/15  Wound culture     Status: None (Preliminary result)   Collection Time: 01/01/15  7:14 PM  Result Value Ref Range Status   Specimen Description ABSCESS  Final   Special Requests NONE  Final   Gram Stain PENDING  Incomplete   Culture   Final    MODERATE GROWTH METHICILLIN RESISTANT STAPHYLOCOCCUS AUREUS CRITICAL RESULT CALLED TO, READ BACK BY AND VERIFIED WITH: DANIELLE JACOBS 01/03/15 1147 JGF    Report Status PENDING  Incomplete   Organism ID, Bacteria METHICILLIN RESISTANT STAPHYLOCOCCUS AUREUS  Final      Susceptibility   Methicillin resistant staphylococcus aureus - MIC*    CIPROFLOXACIN >=8 RESISTANT Resistant     GENTAMICIN <=0.5 SENSITIVE Sensitive     OXACILLIN >=4 RESISTANT Resistant     VANCOMYCIN 1 SENSITIVE Sensitive     TRIMETH/SULFA <=10 SENSITIVE Sensitive     CEFOXITIN SCREEN Value in next row Resistant      POSITIVECEFOXITIN SCREEN - This test may be used to predict mecA-mediated oxacillin resistance, and it is based on the cefoxitin disk screen test.  The cefoxitin screen and oxacillin work in combination to determine the final interpretation reported for oxacillin.     Inducible Clindamycin Value in next row Sensitive      POSITIVECEFOXITIN SCREEN - This test may be used to predict mecA-mediated oxacillin resistance, and it is based on the cefoxitin disk screen test.  The cefoxitin screen and oxacillin work in combination to determine the final interpretation reported for oxacillin.     * MODERATE GROWTH METHICILLIN RESISTANT STAPHYLOCOCCUS AUREUS    RADIOLOGY:  Ct Soft Tissue Neck W Contrast  01/01/2015   CLINICAL DATA:   Right submandibular abscess.  EXAM: CT NECK WITH CONTRAST  TECHNIQUE: Multidetector CT imaging of the neck was performed using the standard protocol following the bolus administration of intravenous contrast.  CONTRASTCarola [id number] [id number] [phone] [id number] [phone] [phone] [phone] [phone] [phone] [phone] [id number] [phone] 08Carola [phone] [phone] [phone](95Marland Kitchen1)[phone] [phone] [phone] Fro308-289-Marland Kitchen54361-54Carola [phone] Kitchen13318 52Carola [phone]-Marland [phone] secretary00 MG/ML  SOLN  COMPARISON:  None.  FINDINGS: Pharynx and larynx: No definite abnormality is noted.  Larynx appears normal. Epiglottis appears normal.  Salivary glands: Parotid and submandibular glands appear normal. Irregular fluid collection measuring 3.1 x 2.5 x 1.3 cm is seen in the superficial portion the right mandibular region consistent with abscess.  Thyroid: 2.7 x 1.5 cm solid mass is seen arising inferiorly from the left thyroid lobe. Thyroid ultrasound is recommended for further evaluation.  Lymph nodes: No significantly enlarged adenopathy is noted.  Vascular: Extensive calcifications are seen involving the carotid bulb and proximal right internal carotid arteries suggesting carotid artery atherosclerosis.  Limited intracranial: No definite abnormality seen.  Visualized orbits: Visualized portions appear normal.  Mastoids and visualized paranasal sinuses: Normal.  Skeleton: Degenerative disc disease is noted at C4-5, C5-6 and C6-7.  Upper chest: Visualized lung parenchyma appears normal. Atherosclerosis of thoracic aorta is noted.  IMPRESSION: 3.1 cm right submandibular abscess is noted.  2.7 cm solid nodule arises inferiorly from the left thyroid lobe. Thyroid ultrasound is recommended for further evaluation.  Probable extensive carotid artery atherosclerosis. Carotid ultrasound is recommended for further evaluation.   Electronically Signed   By: Lupita Raider, M.D.   On: 01/01/2015 15:53    EKG:   Orders placed or performed during the hospital encounter of 12/17/14  . EKG 12-Lead  . EKG 12-Lead  . ED EKG within 10 minutes  . ED EKG within 10 minutes  . EKG      Management plans discussed with the patient, family and they are in  agreement.  CODE STATUS:     Code Status Orders        Start     Ordered   01/01/15 1638  Full code   Continuous     01/01/15 1638    Advance Directive Documentation        Most Recent Value   Type of Advance Directive  Healthcare Power of Attorney, Living will   Pre-existing out of facility DNR order (yellow form or pink MOST form)     "MOST" Form in Place?        TOTAL TIME TAKING CARE OF THIS PATIENT: 35 minutes.  Greater than 50% of time spent in care coordination and counseling.  Elby Showers M.D on 01/03/2015 at 12:01 PM  Between 7am to 6pm - Pager - [phone]  After 6pm go to [email or web address] - password [email or web address] [facility]  [facility]  Office  [phone]  CC: Primary care physician; PROVIDER NOT IN SYSTEM

## 2015-01-03 NOTE — Care Management Important Message (Signed)
Important Message  Patient Details  Name: Cynthia Glenn MRN: [id number] Date of Birth: 1920/04/14   Medicare Important Message Given:  Yes-second notification given    Gwenette Greet, RN 01/03/2015, 9:42 AM

## 2015-01-03 NOTE — Progress Notes (Signed)
CSW was notified that Pt has been medically cleared for dc back to [facility]. CSW met with Pt and her daughter at bedside to discuss Pt's return to ALF. Pt will need private room due to MRSA in her wound. CSW left messages with Resident care coordinator several times before getting confirmation late this afternoon that they will be able to put Pt in her own room at ALF. Pt will provide transportation to ALF.  DC packet was faxed and then given to family to deliver to ALF.   No further CSW needs at this time.   Toma Copier, [place]

## 2015-01-04 LAB — WOUND CULTURE

## 2015-06-28 ENCOUNTER — Emergency Department: Payer: Medicare Other

## 2015-06-28 ENCOUNTER — Inpatient Hospital Stay: Admit: 2015-06-28 | Payer: Medicare Other

## 2015-06-28 ENCOUNTER — Observation Stay
Admission: EM | Admit: 2015-06-28 | Discharge: 2015-06-29 | Payer: Medicare Other | Attending: Internal Medicine | Admitting: Internal Medicine

## 2015-06-28 DIAGNOSIS — Q613 Polycystic kidney, unspecified: Secondary | ICD-10-CM | POA: Diagnosis not present

## 2015-06-28 DIAGNOSIS — Z7982 Long term (current) use of aspirin: Secondary | ICD-10-CM | POA: Diagnosis not present

## 2015-06-28 DIAGNOSIS — I700 Atherosclerosis of aorta: Secondary | ICD-10-CM | POA: Insufficient documentation

## 2015-06-28 DIAGNOSIS — G309 Alzheimer's disease, unspecified: Secondary | ICD-10-CM | POA: Insufficient documentation

## 2015-06-28 DIAGNOSIS — R001 Bradycardia, unspecified: Principal | ICD-10-CM | POA: Insufficient documentation

## 2015-06-28 DIAGNOSIS — R300 Dysuria: Secondary | ICD-10-CM | POA: Insufficient documentation

## 2015-06-28 DIAGNOSIS — R110 Nausea: Secondary | ICD-10-CM | POA: Diagnosis not present

## 2015-06-28 DIAGNOSIS — Z888 Allergy status to other drugs, medicaments and biological substances status: Secondary | ICD-10-CM | POA: Insufficient documentation

## 2015-06-28 DIAGNOSIS — Z79891 Long term (current) use of opiate analgesic: Secondary | ICD-10-CM | POA: Insufficient documentation

## 2015-06-28 DIAGNOSIS — Z8249 Family history of ischemic heart disease and other diseases of the circulatory system: Secondary | ICD-10-CM | POA: Diagnosis not present

## 2015-06-28 DIAGNOSIS — Z90710 Acquired absence of both cervix and uterus: Secondary | ICD-10-CM | POA: Diagnosis not present

## 2015-06-28 DIAGNOSIS — R42 Dizziness and giddiness: Secondary | ICD-10-CM | POA: Insufficient documentation

## 2015-06-28 DIAGNOSIS — G250 Essential tremor: Secondary | ICD-10-CM | POA: Diagnosis not present

## 2015-06-28 DIAGNOSIS — Z8619 Personal history of other infectious and parasitic diseases: Secondary | ICD-10-CM | POA: Insufficient documentation

## 2015-06-28 DIAGNOSIS — I739 Peripheral vascular disease, unspecified: Secondary | ICD-10-CM | POA: Diagnosis not present

## 2015-06-28 DIAGNOSIS — Z881 Allergy status to other antibiotic agents status: Secondary | ICD-10-CM | POA: Insufficient documentation

## 2015-06-28 DIAGNOSIS — E785 Hyperlipidemia, unspecified: Secondary | ICD-10-CM | POA: Insufficient documentation

## 2015-06-28 DIAGNOSIS — I10 Essential (primary) hypertension: Secondary | ICD-10-CM | POA: Insufficient documentation

## 2015-06-28 DIAGNOSIS — N390 Urinary tract infection, site not specified: Secondary | ICD-10-CM | POA: Insufficient documentation

## 2015-06-28 DIAGNOSIS — E538 Deficiency of other specified B group vitamins: Secondary | ICD-10-CM | POA: Insufficient documentation

## 2015-06-28 DIAGNOSIS — F329 Major depressive disorder, single episode, unspecified: Secondary | ICD-10-CM | POA: Insufficient documentation

## 2015-06-28 DIAGNOSIS — J449 Chronic obstructive pulmonary disease, unspecified: Secondary | ICD-10-CM | POA: Insufficient documentation

## 2015-06-28 DIAGNOSIS — Z9889 Other specified postprocedural states: Secondary | ICD-10-CM | POA: Diagnosis not present

## 2015-06-28 DIAGNOSIS — R008 Other abnormalities of heart beat: Secondary | ICD-10-CM | POA: Insufficient documentation

## 2015-06-28 DIAGNOSIS — M1990 Unspecified osteoarthritis, unspecified site: Secondary | ICD-10-CM | POA: Diagnosis not present

## 2015-06-28 DIAGNOSIS — K219 Gastro-esophageal reflux disease without esophagitis: Secondary | ICD-10-CM | POA: Diagnosis not present

## 2015-06-28 DIAGNOSIS — Z8673 Personal history of transient ischemic attack (TIA), and cerebral infarction without residual deficits: Secondary | ICD-10-CM | POA: Diagnosis not present

## 2015-06-28 DIAGNOSIS — Z79899 Other long term (current) drug therapy: Secondary | ICD-10-CM | POA: Diagnosis not present

## 2015-06-28 DIAGNOSIS — I440 Atrioventricular block, first degree: Secondary | ICD-10-CM | POA: Diagnosis not present

## 2015-06-28 DIAGNOSIS — E559 Vitamin D deficiency, unspecified: Secondary | ICD-10-CM | POA: Diagnosis not present

## 2015-06-28 DIAGNOSIS — Z7951 Long term (current) use of inhaled steroids: Secondary | ICD-10-CM | POA: Insufficient documentation

## 2015-06-28 DIAGNOSIS — F0280 Dementia in other diseases classified elsewhere without behavioral disturbance: Secondary | ICD-10-CM | POA: Diagnosis not present

## 2015-06-28 LAB — TROPONIN I: Troponin I: <0.03 ng/mL (ref <0.031)

## 2015-06-28 LAB — COMPREHENSIVE METABOLIC PANEL
ALT: 13 U/L — ABNORMAL LOW (ref 14–54)
ANION GAP: 4 — AB (ref 5–15)
AST: 18 U/L (ref 15–41)
Albumin: 3.5 g/dL (ref 3.5–5.0)
Alkaline Phosphatase: 74 U/L (ref 38–126)
BUN: 19 mg/dL (ref 6–20)
CO2: 29 mmol/L (ref 22–32)
Calcium: 8.9 mg/dL (ref 8.9–10.3)
Chloride: 105 mmol/L (ref 101–111)
Creatinine, Ser: 1 mg/dL (ref 0.44–1.00)
GFR calc Af Amer: 55 mL/min — ABNORMAL LOW (ref >60)
GFR, EST NON AFRICAN AMERICAN: 47 mL/min — AB (ref >60)
Glucose, Bld: 88 mg/dL (ref 65–99)
POTASSIUM: 3.7 mmol/L (ref 3.5–5.1)
Sodium: 138 mmol/L (ref 135–145)
TOTAL PROTEIN: 6.5 g/dL (ref 6.5–8.1)
Total Bilirubin: 0.5 mg/dL (ref 0.3–1.2)

## 2015-06-28 LAB — URINALYSIS COMPLETE WITH MICROSCOPIC (ARMC ONLY)
Bilirubin Urine: NEGATIVE
GLUCOSE, UA: NEGATIVE mg/dL
HGB URINE DIPSTICK: NEGATIVE
KETONES UR: NEGATIVE mg/dL
LEUKOCYTES UA: NEGATIVE
NITRITE: NEGATIVE
Protein, ur: 30 mg/dL — AB
SPECIFIC GRAVITY, URINE: 1.009 (ref 1.005–1.030)
pH: 8 (ref 5.0–8.0)

## 2015-06-28 LAB — CBC WITH DIFFERENTIAL/PLATELET
BASOS ABS: 0 10*3/uL (ref 0–0.1)
BASOS PCT: 1 %
Eosinophils Absolute: 0.1 10*3/uL (ref 0–0.7)
Eosinophils Relative: 2 %
HEMATOCRIT: 39.3 % (ref 35.0–47.0)
HEMOGLOBIN: 13.3 g/dL (ref 12.0–16.0)
Lymphocytes Relative: 12 %
Lymphs Abs: 0.7 10*3/uL — ABNORMAL LOW (ref 1.0–3.6)
MCH: 29.7 pg (ref 26.0–34.0)
MCHC: 33.9 g/dL (ref 32.0–36.0)
MCV: 87.7 fL (ref 80.0–100.0)
MONO ABS: 0.5 10*3/uL (ref 0.2–0.9)
Monocytes Relative: 8 %
NEUTROS ABS: 4.4 10*3/uL (ref 1.4–6.5)
NEUTROS PCT: 77 %
Platelets: 177 10*3/uL (ref 150–440)
RBC: 4.49 MIL/uL (ref 3.80–5.20)
RDW: 14.5 % (ref 11.5–14.5)
WBC: 5.7 10*3/uL (ref 3.6–11.0)

## 2015-06-28 LAB — BRAIN NATRIURETIC PEPTIDE: B NATRIURETIC PEPTIDE 5: 289 pg/mL — AB (ref 0.0–100.0)

## 2015-06-28 LAB — MRSA PCR SCREENING: MRSA by PCR: NEGATIVE

## 2015-06-28 MED ORDER — BISACODYL 5 MG PO TBEC: 5.0000 mg | DELAYED_RELEASE_TABLET | Freq: Every day | ORAL | Status: DC | PRN

## 2015-06-28 MED ORDER — PHENAZOPYRIDINE HCL 200 MG PO TABS
200.0000 mg | ORAL_TABLET | Freq: Three times a day (TID) | ORAL | Status: DC
  Filled 2015-06-28 (×2): qty 1

## 2015-06-28 MED ORDER — ASPIRIN EC 81 MG PO TBEC
DELAYED_RELEASE_TABLET | ORAL | Status: AC
  Filled 2015-06-28: qty 1

## 2015-06-28 MED ORDER — ASPIRIN EC 81 MG PO TBEC
81.0000 mg | DELAYED_RELEASE_TABLET | Freq: Every day | ORAL | Status: DC
  Administered 2015-06-29: 81 mg via ORAL
  Filled 2015-06-28: qty 1

## 2015-06-28 MED ORDER — IRBESARTAN 75 MG PO TABS
37.5000 mg | ORAL_TABLET | Freq: Every day | ORAL | Status: DC
  Administered 2015-06-29: 37.5 mg via ORAL
  Filled 2015-06-28 (×2): qty 0.5

## 2015-06-28 MED ORDER — QUETIAPINE FUMARATE ER 50 MG PO TB24
150.0000 mg | ORAL_TABLET | Freq: Every day | ORAL | Status: DC
  Filled 2015-06-28 (×2): qty 3

## 2015-06-28 MED ORDER — DOCUSATE SODIUM 100 MG PO CAPS: 100.0000 mg | ORAL_CAPSULE | Freq: Two times a day (BID) | ORAL | Status: DC

## 2015-06-28 MED ORDER — PHENAZOPYRIDINE HCL 200 MG PO TABS
200.0000 mg | ORAL_TABLET | Freq: Three times a day (TID) | ORAL | Status: DC | PRN
  Filled 2015-06-28 (×3): qty 1

## 2015-06-28 MED ORDER — PRAVASTATIN SODIUM 20 MG PO TABS
20.0000 mg | ORAL_TABLET | Freq: Every day | ORAL | Status: DC
  Administered 2015-06-28 – 2015-06-29 (×2): 20 mg via ORAL
  Filled 2015-06-28: qty 1

## 2015-06-28 MED ORDER — TRAZODONE HCL 50 MG PO TABS: 25.0000 mg | ORAL_TABLET | Freq: Every evening | ORAL | Status: DC | PRN

## 2015-06-28 MED ORDER — DOCUSATE SODIUM 100 MG PO CAPS: 100.0000 mg | ORAL_CAPSULE | Freq: Two times a day (BID) | ORAL | Status: DC | PRN

## 2015-06-28 MED ORDER — OCUVITE-LUTEIN PO CAPS
1.0000 | ORAL_CAPSULE | Freq: Every day | ORAL | Status: DC
  Administered 2015-06-29: 1 via ORAL
  Filled 2015-06-28: qty 1

## 2015-06-28 MED ORDER — CALCIUM CARBONATE-VITAMIN D 500-200 MG-UNIT PO TABS
1.0000 | ORAL_TABLET | Freq: Every day | ORAL | Status: DC
  Administered 2015-06-29: 1 via ORAL
  Filled 2015-06-28: qty 1

## 2015-06-28 MED ORDER — ACETAMINOPHEN 650 MG RE SUPP: 650.0000 mg | Freq: Four times a day (QID) | RECTAL | Status: DC | PRN

## 2015-06-28 MED ORDER — ENOXAPARIN SODIUM 40 MG/0.4ML ~~LOC~~ SOLN
40.0000 mg | SUBCUTANEOUS | Status: DC
  Administered 2015-06-28: 40 mg via SUBCUTANEOUS

## 2015-06-28 MED ORDER — RISAQUAD PO CAPS
2.0000 | ORAL_CAPSULE | Freq: Three times a day (TID) | ORAL | Status: DC
  Administered 2015-06-28 – 2015-06-29 (×2): 2 via ORAL
  Filled 2015-06-28 (×3): qty 2

## 2015-06-28 MED ORDER — CYANOCOBALAMIN 500 MCG PO TABS
500.0000 ug | ORAL_TABLET | Freq: Every day | ORAL | Status: DC
  Administered 2015-06-29: 500 ug via ORAL
  Filled 2015-06-28: qty 1

## 2015-06-28 MED ORDER — VITAMIN D 1000 UNITS PO TABS
2000.0000 [IU] | ORAL_TABLET | Freq: Every day | ORAL | Status: DC
  Administered 2015-06-29: 2000 [IU] via ORAL
  Filled 2015-06-28: qty 2

## 2015-06-28 MED ORDER — AMLODIPINE BESYLATE 10 MG PO TABS
10.0000 mg | ORAL_TABLET | Freq: Every day | ORAL | Status: DC
  Administered 2015-06-29: 10 mg via ORAL
  Filled 2015-06-28: qty 1

## 2015-06-28 MED ORDER — PRAVASTATIN SODIUM 40 MG PO TABS
ORAL_TABLET | ORAL | Status: AC
  Administered 2015-06-28: 20 mg via ORAL
  Filled 2015-06-28: qty 1

## 2015-06-28 MED ORDER — ONDANSETRON HCL 4 MG PO TABS: 4.0000 mg | ORAL_TABLET | Freq: Three times a day (TID) | ORAL | Status: DC | PRN

## 2015-06-28 MED ORDER — DONEPEZIL HCL 5 MG PO TABS: 10.0000 mg | ORAL_TABLET | Freq: Every day | ORAL | Status: DC

## 2015-06-28 MED ORDER — ENOXAPARIN SODIUM 40 MG/0.4ML ~~LOC~~ SOLN
SUBCUTANEOUS | Status: AC
  Administered 2015-06-28: 40 mg via SUBCUTANEOUS
  Filled 2015-06-28: qty 0.4

## 2015-06-28 MED ORDER — ONDANSETRON HCL 4 MG/2ML IJ SOLN: 4.0000 mg | Freq: Four times a day (QID) | INTRAMUSCULAR | Status: DC | PRN

## 2015-06-28 MED ORDER — ENOXAPARIN SODIUM 40 MG/0.4ML ~~LOC~~ SOLN: 40.0000 mg | SUBCUTANEOUS | Status: DC

## 2015-06-28 MED ORDER — HYDROCODONE-ACETAMINOPHEN 5-325 MG PO TABS: 1.0000 | ORAL_TABLET | ORAL | Status: DC | PRN

## 2015-06-28 MED ORDER — LORAZEPAM 2 MG/ML IJ SOLN
0.5000 mg | Freq: Once | INTRAMUSCULAR | Status: AC
  Administered 2015-06-28: 0.5 mg via INTRAVENOUS
  Filled 2015-06-28: qty 1

## 2015-06-28 MED ORDER — BUSPIRONE HCL 5 MG PO TABS
15.0000 mg | ORAL_TABLET | Freq: Three times a day (TID) | ORAL | Status: DC
  Administered 2015-06-29: 15 mg via ORAL
  Filled 2015-06-28: qty 3
  Filled 2015-06-28: qty 2

## 2015-06-28 MED ORDER — PANTOPRAZOLE SODIUM 40 MG PO TBEC
40.0000 mg | DELAYED_RELEASE_TABLET | Freq: Every day | ORAL | Status: DC
  Administered 2015-06-28 – 2015-06-29 (×2): 40 mg via ORAL
  Filled 2015-06-28 (×2): qty 1

## 2015-06-28 MED ORDER — SULFAMETHOXAZOLE-TRIMETHOPRIM 400-80 MG PO TABS
1.0000 | ORAL_TABLET | Freq: Two times a day (BID) | ORAL | Status: DC
  Administered 2015-06-28 – 2015-06-29 (×2): 1 via ORAL
  Filled 2015-06-28 (×4): qty 1

## 2015-06-28 MED ORDER — CRANBERRY 500 MG PO CAPS: 500.0000 mg | ORAL_CAPSULE | Freq: Every day | ORAL | Status: DC

## 2015-06-28 MED ORDER — FLUTICASONE PROPIONATE 50 MCG/ACT NA SUSP
2.0000 | Freq: Every day | NASAL | Status: DC
  Administered 2015-06-28: 2 via NASAL
  Filled 2015-06-28: qty 16

## 2015-06-28 MED ORDER — SULFAMETHOXAZOLE-TRIMETHOPRIM 800-160 MG PO TABS
ORAL_TABLET | ORAL | Status: AC
  Filled 2015-06-28: qty 1

## 2015-06-28 MED ORDER — ACETAMINOPHEN 325 MG PO TABS: 650.0000 mg | ORAL_TABLET | Freq: Four times a day (QID) | ORAL | Status: DC | PRN

## 2015-06-28 MED ORDER — ALBUTEROL SULFATE (2.5 MG/3ML) 0.083% IN NEBU: 2.5000 mg | INHALATION_SOLUTION | Freq: Four times a day (QID) | RESPIRATORY_TRACT | Status: DC | PRN

## 2015-06-28 MED ORDER — ONDANSETRON HCL 4 MG PO TABS: 4.0000 mg | ORAL_TABLET | Freq: Four times a day (QID) | ORAL | Status: DC | PRN

## 2015-06-28 NOTE — H&P (Signed)
[facility] [place] at [facility]   PATIENT NAME: Cynthia GoodpastureBetty Glenn    MR#:  [id number]  DATE OF BIRTH:  06/25/1920  DATE OF ADMISSION:  06/28/2015  PRIMARY CARE PHYSICIAN: PROVIDER NOT IN SYSTEM   REQUESTING/REFERRING PHYSICIAN: Dr. Shaune PollackLord  CHIEF COMPLAINT: Low heart rate    Chief Complaint  Patient presents with  . Bradycardia    HISTORY OF PRESENT ILLNESS:  Cynthia Glenn  is a 82 y.o. female with a known history of hypertension on atenolol noted to have low heart rate this morning. Patient's heart rate was in 30s according to PA that saw the patient this morning. Physician assistant states that she checked the heart rate, and patient's heart rate was in 30s. And at that time patient was nauseated and dizzy and not at her baseline and she was unable to sit out without being nauseated and dizzy. Patient also had poor energy. Patient is a resident of  [facility]. Patient does have Alzheimer's dementia unable to give a good history. Because of sinus bradycardia with symptoms patient was evaluated in the emergency room. Patient to blood tests CBC,BMPare normal.And we are going to stop the atenolol and monitor on telemetry overnight to see if she develops any further bradycardia episodes. PAST MEDICAL HISTORY:   Past Medical History  Diagnosis Date  . Arthritis   . Hypertension   . Dementia   . COPD (chronic obstructive pulmonary disease) (HCC)   . Shingles   . Seasonal allergies   . Polycystic kidney disease   . Vitamin B12 deficiency   . Dementia   . PVD (peripheral vascular disease) (HCC)   . Vitamin D deficiency   . Essential tremor   . CVA (cerebral infarction)   . GERD (gastroesophageal reflux disease)   . HTN (hypertension)   . Hyperlipemia     PAST SURGICAL HISTOIRY:   Past Surgical History  Procedure Laterality Date  . Hysterotomy    . Hernia repair    . Orif ankle fracture    . Cataract extraction    . Colon surgery      SOCIAL  HISTORY:   Social History  Substance Use Topics  . Smoking status: Never Smoker   . Smokeless tobacco: Not on file  . Alcohol Use: No    FAMILY HISTORY:   Family History  Problem Relation Age of Onset  . Hypertension      DRUG ALLERGIES:   Allergies  Allergen Reactions  . Lisinopril Cough  . Statins Hives  . Ciprofloxacin     REVIEW OF SYSTEMS:  CONSTITUTIONAL: No fever, fatigue or weakness.  EYES: No blurred or double vision.  EARS, NOSE, AND THROAT: No tinnitus or ear pain.  RESPIRATORY: No cough, shortness of breath, wheezing or hemoptysis.  CARDIOVASCULAR: No chest pain, orthopnea, edema.  GASTROINTESTINAL: No nausea, vomiting, diarrhea or abdominal pain.  GENITOURINARY: No dysuria, hematuria.  ENDOCRINE: No polyuria, nocturia,  HEMATOLOGY: No anemia, easy bruising or bleeding SKIN: No rash or lesion. MUSCULOSKELETAL: No joint pain or arthritis.   NEUROLOGIC: No tingling, numbness, weakness.  PSYCHIATRY: No anxiety or depression.   MEDICATIONS AT HOME:   Prior to Admission medications   Medication Sig Start Date End Date Taking? Authorizing Provider  albuterol (PROVENTIL) (2.5 MG/3ML) 0.083% nebulizer solution Take 2.5 mg by nebulization every 6 (six) hours as needed for wheezing or shortness of breath.   Yes Historical Provider, MD  amLODipine (NORVASC) 10 MG tablet Take 10 mg by mouth  daily.   Yes Historical Provider, MD  aspirin 81 MG tablet Take 81 mg by mouth daily.   Yes Historical Provider, MD  atenolol (TENORMIN) 100 MG tablet Take 100 mg by mouth daily.   Yes Historical Provider, MD  benzonatate (TESSALON) 100 MG capsule Take 100 mg by mouth 3 (three) times daily as needed for cough.    Yes Historical Provider, MD  busPIRone (BUSPAR) 15 MG tablet Take 15 mg by mouth 3 (three) times daily.   Yes Historical Provider, MD  calcium-vitamin D (OSCAL WITH D) 500-200 MG-UNIT tablet Take 1 tablet by mouth.   Yes Historical Provider, MD  Cholecalciferol 2000  units TABS Take 2,000 Units by mouth daily.   Yes Historical Provider, MD  Cranberry (CRANBERRY CONCENTRATE) 500 MG CAPS Take 500 mg by mouth daily.   Yes Historical Provider, MD  docusate sodium (COLACE) 100 MG capsule Take 100 mg by mouth 2 (two) times daily as needed for mild constipation.   Yes Historical Provider, MD  donepezil (ARICEPT) 10 MG tablet Take 10 mg by mouth at bedtime.   Yes Historical Provider, MD  fluticasone (FLONASE) 50 MCG/ACT nasal spray Place 2 sprays into both nostrils daily.    Yes Historical Provider, MD  HYDROcodone-acetaminophen (NORCO/VICODIN) 5-325 MG tablet Take 1 tablet by mouth 2 (two) times daily.    Yes Historical Provider, MD  lactobacillus acidophilus (BACID) TABS tablet Take 2 tablets by mouth 3 (three) times daily.   Yes Historical Provider, MD  Multiple Vitamins-Minerals (ICAPS LUTEIN & ZEAXANTHIN PO) Take 1 capsule by mouth 2 (two) times daily.   Yes Historical Provider, MD  multivitamin-lutein (OCUVITE-LUTEIN) CAPS capsule Take 1 capsule by mouth daily.   Yes Historical Provider, MD  omeprazole (PRILOSEC) 20 MG capsule Take 20 mg by mouth daily.   Yes Historical Provider, MD  ondansetron (ZOFRAN) 4 MG tablet Take 4 mg by mouth every 8 (eight) hours as needed for nausea or vomiting.   Yes Historical Provider, MD  phenazopyridine (PYRIDIUM) 200 MG tablet Take 200 mg by mouth every 8 (eight) hours as needed for pain.   Yes Historical Provider, MD  pravastatin (PRAVACHOL) 20 MG tablet Take 20 mg by mouth daily.   Yes Historical Provider, MD  QUEtiapine (SEROQUEL XR) 50 MG TB24 24 hr tablet Take 150 mg by mouth at bedtime.   Yes Historical Provider, MD  sulfamethoxazole-trimethoprim (BACTRIM,SEPTRA) 400-80 MG tablet Take 1 tablet by mouth 2 (two) times daily. 06/25/15 07/02/15 Yes Historical Provider, MD  valsartan (DIOVAN) 320 MG tablet Take 320 mg by mouth daily.   Yes Historical Provider, MD  vitamin B-12 (CYANOCOBALAMIN) 500 MCG tablet Take 500 mcg by mouth  daily.   Yes Historical Provider, MD      VITAL SIGNS:  Blood pressure 142/80, pulse 32, temperature 98 F (36.7 C), temperature source Oral, resp. rate 14, weight 79.833 kg (176 lb), SpO2 100 %.  PHYSICAL EXAMINATION:  GENERAL:  82 y.o.-year-old patient lying in the bed with no acute distress.  EYES: Pupils equal, round, reactive to light and accommodation. No scleral icterus. Extraocular muscles intact.  HEENT: Head atraumatic, normocephalic. Oropharynx and nasopharynx clear.  NECK:  Supple, no jugular venous distention. No thyroid enlargement, no tenderness.  LUNGS: Normal breath sounds bilaterally, no wheezing, rales,rhonchi or crepitation. No use of accessory muscles of respiration.  CARDIOVASCULAR: S1, S2 normal. No murmurs, rubs, or gallops.  ABDOMEN: Soft, nontender, nondistended. Bowel sounds present. No organomegaly or mass.  EXTREMITIES: No pedal edema, cyanosis, or  clubbing.  NEUROLOGIC: Cranial nerves II through XII are intact. Muscle strength 5/5 in all extremities. Sensation intact. Gait not checked.  PSYCHIATRIC: The patient is alert and oriented x 3.  SKIN: No obvious rash, lesion, or ulcer.   LABORATORY PANEL:   CBC  Recent Labs Lab 06/28/15 1122  WBC 5.7  HGB 13.3  HCT 39.3  PLT 177   ------------------------------------------------------------------------------------------------------------------  Chemistries   Recent Labs Lab 06/28/15 1122  NA 138  K 3.7  CL 105  CO2 29  GLUCOSE 88  BUN 19  CREATININE 1.00  CALCIUM 8.9  AST 18  ALT 13*  ALKPHOS 74  BILITOT 0.5   ------------------------------------------------------------------------------------------------------------------  Cardiac Enzymes  Recent Labs Lab 06/28/15 1122  TROPONINI <0.03   ------------------------------------------------------------------------------------------------------------------  RADIOLOGY:  Dg Chest Port 1 View  06/28/2015  CLINICAL DATA:  82 year old  female with bradycardia. Nausea. Initial encounter. EXAM: PORTABLE CHEST 1 VIEW COMPARISON:  12/17/2014 and earlier. FINDINGS: Lung volumes are stable. Lungs remain clear when allowing for portable technique. Stable cardiac size and mediastinal contours. Visualized tracheal air column is within normal limits. No pneumothorax. Calcified aortic atherosclerosis. IMPRESSION: No acute cardiopulmonary abnormality. Electronically Signed   By: Odessa Fleming M.D.   On: 06/28/2015 11:47    EKG:   Orders placed or performed during the hospital encounter of 06/28/15  . EKG 12-Lead  . EKG 12-Lead  . EKG 12-Lead  . EKG 12-Lead   sinus rhythm at 70 bpm with  Supraventricular bigeminy  IMPRESSION AND PLAN:  #1 symptomatic sinus bradycardia with first-degree AV block followed by a bigeminy pattern with supraventricular narrow complex beat: Likely due to beta blockers: So hold the atenolol, monitor on telemetry overnight. Check cardiac markers, echocardiogram. #2 history of COPD: No wheezing. #3 Alzheimer's dementia:: Continue Aricept. #4 depression: Continue Cymbalta, when necessary Ativan.  5 recent UTI: Bactrim: Continue that. UA looks within normal limits Discussed with patient's daughter and son. .   All the records are reviewed and case discussed with ED provider. Management plans discussed with the patient, family and they are in agreement.  CODE STATUS: full  TOTAL TIME TAKING CARE OF THIS PATIENT: 55 minutes.    Katha Hamming M.D on 06/28/2015 at 2:41 PM  Between 7am to 6pm - Pager - [phone]  After 6pm go to [email or web address] - password [email or web address]  [facility]  Office  [phone]  CC: Primary care physician; PROVIDER NOT IN SYSTEM  Note: This dictation was prepared with Dragon dictation along with smaller phrase technology. Any transcriptional errors that result from this process are unintentional.

## 2015-06-28 NOTE — Progress Notes (Signed)
PHARMACIST - PHYSICIAN ORDER COMMUNICATION  CONCERNING: P&T Medication Policy on Herbal Medications  DESCRIPTION:  This patient's order for:  cranberry  has been noted.  This product(s) is classified as an "herbal" or natural product. Due to a lack of definitive safety studies or FDA approval, nonstandard manufacturing practices, plus the potential risk of unknown drug-drug interactions while on inpatient medications, the Pharmacy and Therapeutics Committee does not permit the use of "herbal" or natural products of this type within [place].   ACTION TAKEN: The pharmacy department is unable to verify this order at this time and your patient has been informed of this safety policy. Please reevaluate patient's clinical condition at discharge and address if the herbal or natural product(s) should be resumed at that time.   

## 2015-06-28 NOTE — ED Notes (Addendum)
Pharmacy called and notified of need of medication. States they will send it up. Patient and family made aware.

## 2015-06-28 NOTE — ED Notes (Signed)
Pt arrives via EMS from the [facility]. Pt sent for concern of low heartrate (30's) this morning.  Pt report having nausea throughout the night.

## 2015-06-28 NOTE — ED Notes (Signed)
Zoll pads placed on patient at this time per Dr. Shaune PollackLord verbal order. HR 59.

## 2015-06-28 NOTE — ED Provider Notes (Signed)
[facility] Emergency Department Provider Note   ____________________________________________  Time seen: Approximately 11:05AM I have reviewed the triage vital signs and the triage nursing note.  HISTORY  Chief Complaint Bradycardia   Historian Limited history per patient, poor historian Son provided some history at the bedside Also spoke by phone with provider at the [facility]  HPI Cynthia Glenn is a 82 y.o. female who stays in a nursing home, was sent here due to her rate in the 30s reportedly.    Per family, no known cardiac disease, does not follow with a cadiologist. Pt reports recent uti --- med sheet shows bactrim since April 1st.  Additional history obtained from the physician assistant saw the patient this morning. The physician assistant states that she checked the heart rate with the automatic monitor and it showed in the 30s, patient was not her typical baseline, unable to sit out without being coming nauseated and dizzy. The patient had no energy at all, when the PA was examining her.  The physician assistant did state that she also manually checked her radial pulse and it was in the 30s, consistent with symptomatic bradycardia.  She also living of the patient has been on and off medications for urinary tract infection not confirmed by a UA, but based on symptoms, and that the patient complains of dysuria all the time. She has been on Bactrim here in the last couple of days.    Past Medical History  Diagnosis Date  . Arthritis   . Hypertension   . Dementia   . COPD (chronic obstructive pulmonary disease) (HCC)   . Shingles   . Seasonal allergies   . Polycystic kidney disease   . Vitamin B12 deficiency   . Dementia   . PVD (peripheral vascular disease) (HCC)   . Vitamin D deficiency   . Essential tremor   . CVA (cerebral infarction)   . GERD (gastroesophageal reflux disease)   . HTN (hypertension)   . Hyperlipemia     Patient  Active Problem List   Diagnosis Date Noted  . Cellulitis and abscess of neck 01/03/2015  . Submandibular abscess 01/03/2015    Past Surgical History  Procedure Laterality Date  . Hysterotomy    . Hernia repair    . Orif ankle fracture    . Cataract extraction    . Colon surgery      Current Outpatient Rx  Name  Route  Sig  Dispense  Refill  . albuterol (PROVENTIL) (2.5 MG/3ML) 0.083% nebulizer solution   Nebulization   Take 2.5 mg by nebulization every 6 (six) hours as needed for wheezing or shortness of breath.         Marland Kitchen amLODipine (NORVASC) 10 MG tablet   Oral   Take 10 mg by mouth daily.         Marland Kitchen aspirin 81 MG tablet   Oral   Take 81 mg by mouth daily.         Marland Kitchen atenolol (TENORMIN) 100 MG tablet   Oral   Take 100 mg by mouth daily.         . benzonatate (TESSALON) 100 MG capsule   Oral   Take 100 mg by mouth 3 (three) times daily as needed for cough.          . busPIRone (BUSPAR) 15 MG tablet   Oral   Take 15 mg by mouth 3 (three) times daily.         Marland Kitchen  calcium-vitamin D (OSCAL WITH D) 500-200 MG-UNIT tablet   Oral   Take 1 tablet by mouth.         . Cholecalciferol 2000 units TABS   Oral   Take 2,000 Units by mouth daily.         . Cranberry (CRANBERRY CONCENTRATE) 500 MG CAPS   Oral   Take 500 mg by mouth daily.         Marland Kitchen docusate sodium (COLACE) 100 MG capsule   Oral   Take 100 mg by mouth 2 (two) times daily as needed for mild constipation.         Marland Kitchen donepezil (ARICEPT) 10 MG tablet   Oral   Take 10 mg by mouth at bedtime.         . fluticasone (FLONASE) 50 MCG/ACT nasal spray   Each Nare   Place 2 sprays into both nostrils daily.          Marland Kitchen HYDROcodone-acetaminophen (NORCO/VICODIN) 5-325 MG tablet   Oral   Take 1 tablet by mouth 2 (two) times daily.          Marland Kitchen lactobacillus acidophilus (BACID) TABS tablet   Oral   Take 2 tablets by mouth 3 (three) times daily.         . Multiple Vitamins-Minerals (ICAPS  LUTEIN & ZEAXANTHIN PO)   Oral   Take 1 capsule by mouth 2 (two) times daily.         . multivitamin-lutein (OCUVITE-LUTEIN) CAPS capsule   Oral   Take 1 capsule by mouth daily.         Marland Kitchen omeprazole (PRILOSEC) 20 MG capsule   Oral   Take 20 mg by mouth daily.         . ondansetron (ZOFRAN) 4 MG tablet   Oral   Take 4 mg by mouth every 8 (eight) hours as needed for nausea or vomiting.         . phenazopyridine (PYRIDIUM) 200 MG tablet   Oral   Take 200 mg by mouth every 8 (eight) hours as needed for pain.         . pravastatin (PRAVACHOL) 20 MG tablet   Oral   Take 20 mg by mouth daily.         . QUEtiapine (SEROQUEL XR) 50 MG TB24 24 hr tablet   Oral   Take 150 mg by mouth at bedtime.         . sulfamethoxazole-trimethoprim (BACTRIM,SEPTRA) 400-80 MG tablet   Oral   Take 1 tablet by mouth 2 (two) times daily.         . valsartan (DIOVAN) 320 MG tablet   Oral   Take 320 mg by mouth daily.         . vitamin B-12 (CYANOCOBALAMIN) 500 MCG tablet   Oral   Take 500 mcg by mouth daily.           Allergies Lisinopril; Statins; and Ciprofloxacin  Family History  Problem Relation Age of Onset  . Hypertension      Social History Social History  Substance Use Topics  . Smoking status: Never Smoker   . Smokeless tobacco: Not on file  . Alcohol Use: No    Review of Systems  Constitutional: Negative for fever. Eyes: Negative for visual changes. ENT: Negative for sore throat. Cardiovascular: Negative for chest pain. Denies palpitations Respiratory: Negative for shortness of breath. Gastrointestinal: Negative for abdominal pain, vomiting and diarrhea. Genitourinary: Negative for dysuria.  Currently on abx for uti Musculoskeletal: Negative for back pain. Skin: Negative for rash. Neurological: Negative for headache. 10 point Review of Systems otherwise negative ____________________________________________   PHYSICAL EXAM:  VITAL SIGNS: ED  Triage Vitals  Enc Vitals Group     BP 06/28/15 1058 167/42 mmHg     Pulse Rate 06/28/15 1058 33     Resp 06/28/15 1058 18     Temp 06/28/15 1058 98 F (36.7 C)     Temp Source 06/28/15 1058 Oral     SpO2 06/28/15 1058 100 %     Weight 06/28/15 1058 176 lb (79.833 kg)     Height --      Head Cir --      Peak Flow --      Pain Score --      Pain Loc --      Pain Edu? --      Excl. in GC? --      Constitutional: Alert and cooperative. Well appearing and in no distress. HEENT   Head: Normocephalic and atraumatic.      Eyes: Conjunctivae are normal. PERRL. Normal extraocular movements.      Ears:         Nose: No congestion/rhinnorhea.   Mouth/Throat: Mucous membranes are moist.   Neck: No stridor. Cardiovascular/Chest: Regularly irregular, hr near 60.  No murmurs, rubs, or gallops. Respiratory: Normal respiratory effort without tachypnea nor retractions. Breath sounds are clear and equal bilaterally. No wheezes/rales/rhonchi. Gastrointestinal: Soft. No distention, no guarding, no rebound. Nontender.    Genitourinary/rectal:Deferred Musculoskeletal: Nontender with normal range of motion in all extremities. No joint effusions.  No lower extremity tenderness.  No edema. Neurologic:  Poor historian. No slurred speech or aphasia. No gross or focal neurologic deficits are appreciated. Skin:  Skin is warm, dry and intact. No rash noted.   ____________________________________________   EKG I, Governor Rooks, MD, the attending physician have personally viewed and interpreted all ECGs.  Regular rate and irregular rhythm. Around 60 bpm. Rhythm appears to be a sinus rhythm with first-degree AV block followed by a bigeminy pattern with a supraventricular/narrow complex beat following each sinus beat. Narrow QRS. Left axis deviation. Nonspecific T-wave with some T-wave flattening/inversion anterior laterally.  ____________________________________________  LABS (pertinent  positives/negatives)  Conference metabolic panel without significant abnormality Troponin less than 0.03 CBC within normal limits Urinalysis rare bacteria and otherwise negative BNP 289  ____________________________________________  RADIOLOGY All Xrays were viewed by me. Imaging interpreted by Radiologist.  Chest one view portable: No acute cardio pulmonary abnormality. __________________________________________  PROCEDURES  Procedure(s) performed: None  Critical Care performed: None  ____________________________________________   ED COURSE / ASSESSMENT AND PLAN  Pertinent labs & imaging results that were available during my care of the patient were reviewed by me and considered in my medical decision making (see chart for details).   No prior history of arrhythmia. Her EKG today is regularly irregular with sinus rhythm and a supraventricular bigeminy.  The report was that her heart rate was in the 30s, and I'm suspicious that this was heart rate erroneously reported off of a monitor, as our pulse ox monitor is also reporting heart rate in the 30s, while the cardiac monitor shows HR in the 60s and radial palpated pulse is in the 60s.  It appears that if the monitor was counting the heartbeat between the longest distance between the long bigeminy beats, the heart rate would be cardiac related to be in the 30s.  I spoke with the physician assistant, it sounds like the patient really was having symptomatic bradycardia. She was not perfusing 60 bpm, but was apparently having a radial pulse in the 30s, while experiencing nausea and dizziness and inability to sit up.   Given this history, the patient is an anion observation monitoring for 24 hours on telemetry. She does have this superficial ventricular bigeminy heart arrhythmia today which is new for her.  Her atenolol will be held. Patient was placed on cardiac pads during her monitored stay.  She does not appear to have an  active urinary tract infection. Her I's are within normal limits. She does not appear dehydrated. And her hemoglobin is within normal limits.   CONSULTATIONS:   Face-to-face with hospitalist for admission.   Patient / Family / Caregiver informed of clinical course, medical decision-making process, and agree with plan.     ___________________________________________   FINAL CLINICAL IMPRESSION(S) / ED DIAGNOSES   Final diagnoses:  Symptomatic bradycardia              Note: This dictation was prepared with Dragon dictation. Any transcriptional errors that result from this process are unintentional   Governor Rooksebecca Rosalynn Sergent, MD 06/28/15 1404

## 2015-06-29 ENCOUNTER — Observation Stay
Admit: 2015-06-29 | Discharge: 2015-06-29 | Disposition: A | Discharge status: Home / Self Care | Payer: Medicare Other | Attending: Internal Medicine | Admitting: Internal Medicine

## 2015-06-29 DIAGNOSIS — R001 Bradycardia, unspecified: Secondary | ICD-10-CM | POA: Diagnosis not present

## 2015-06-29 LAB — CBC
HEMATOCRIT: 38.8 % (ref 35.0–47.0)
HEMOGLOBIN: 13.3 g/dL (ref 12.0–16.0)
MCH: 30.2 pg (ref 26.0–34.0)
MCHC: 34.2 g/dL (ref 32.0–36.0)
MCV: 88.3 fL (ref 80.0–100.0)
Platelets: 181 10*3/uL (ref 150–440)
RBC: 4.39 MIL/uL (ref 3.80–5.20)
RDW: 14.4 % (ref 11.5–14.5)
WBC: 4.4 10*3/uL (ref 3.6–11.0)

## 2015-06-29 LAB — ECHOCARDIOGRAM COMPLETE
Height: 61 in
Weight: 2563.2 oz

## 2015-06-29 LAB — BASIC METABOLIC PANEL
ANION GAP: 3 — AB (ref 5–15)
BUN: 22 mg/dL — AB (ref 6–20)
CALCIUM: 8.7 mg/dL — AB (ref 8.9–10.3)
CO2: 28 mmol/L (ref 22–32)
Chloride: 109 mmol/L (ref 101–111)
Creatinine, Ser: 1.04 mg/dL — ABNORMAL HIGH (ref 0.44–1.00)
GFR calc Af Amer: 52 mL/min — ABNORMAL LOW (ref >60)
GFR calc non Af Amer: 45 mL/min — ABNORMAL LOW (ref >60)
GLUCOSE: 92 mg/dL (ref 65–99)
POTASSIUM: 3.9 mmol/L (ref 3.5–5.1)
Sodium: 140 mmol/L (ref 135–145)

## 2015-06-29 LAB — GLUCOSE, CAPILLARY: GLUCOSE-CAPILLARY: 96 mg/dL (ref 65–99)

## 2015-06-29 LAB — TROPONIN I

## 2015-06-29 NOTE — Care Management Note (Addendum)
Case Management Note  Patient Details  Name: Cynthia Glenn MRN: [id number] Date of Birth: Mar 08, 1921  Subjective/Objective:  Patient asleep and would not wake up for CM. [facility] Explained medicare obsveration letter.  He verbalized understanding. Original left in room. Patient lives at the [facility]. She uses a walker. She is not receiving any home health services at this time. Patient is not on home O2. CSW consult in. WIll assist as needed.                  Action/Plan: DC when medically stable.   Expected Discharge Date:                  Expected Discharge Plan:  Assisted Living / Rest Home ([facility])  In-House Referral:  Clinical Social Work  Discharge planning Services     Post Acute Care Choice:    Choice offered to:     DME Arranged:    DME Agency:     HH Arranged:    HH Agency:     Status of Service:     Medicare Important Message Given:    Date Medicare IM Given:    Medicare IM give by:    Date Additional Medicare IM Given:    Additional Medicare Important Message give by:     If discussed at Long Length of Stay Meetings, dates discussed:    Additional Comments:  Marily MemosLisa M Pricilla Moehle, RN 06/29/2015, 9:38 AM

## 2015-06-29 NOTE — Progress Notes (Signed)
[facility]. Education provided, no questions at this time. Patient picked up by daughter. Telemetry removed. Trudee KusterBrandi R Mansfield

## 2015-06-29 NOTE — Care Management (Signed)
Daughter Cynthia Glenn in room. Explained Medicare observation notice. She verbalized understanding. She thinks patient is receiving PT at the ALF but isnt sure. Will work with CSW to follow up with facility on any potential home health needs.

## 2015-06-29 NOTE — NC FL2 (Signed)
MEDICAID FL2 LEVEL OF CARE SCREENING TOOL     IDENTIFICATION  Patient Name: Cynthia Glenn Birthdate: 1920/08/12 Sex: female Admission Date (Current Location): 06/28/2015  [facility] [place]  [place]  ([id number]) Facility and Address:  [facility], [address], [place], [place] [address]      Provider Number: [id number]  Attending Physician Name and Address:  Delfino LovettVipul Shah, MD  Relative Name and Phone Number:       Current Level of Care: Hospital Recommended Level of Care: Assisted Living Facility Prior Approval Number:    Date Approved/Denied:   PASRR Number:  [id number])  Discharge Plan: Domiciliary (Rest home)    Current Diagnoses: Patient Active Problem List   Diagnosis Date Noted  . Bradycardia 06/28/2015  . Cellulitis and abscess of neck 01/03/2015  . Submandibular abscess 01/03/2015    Orientation RESPIRATION BLADDER Height & Weight     Self, Place  Normal Continent Weight: 160 lb 3.2 oz (72.666 kg) Height:  5\' 1"  (154.9 cm)  BEHAVIORAL SYMPTOMS/MOOD NEUROLOGICAL BOWEL NUTRITION STATUS   (None)  (None) Continent Diet (Heart)  AMBULATORY STATUS COMMUNICATION OF NEEDS Skin   Limited Assist Verbally Normal                       Personal Care Assistance Level of Assistance  Bathing, Feeding, Dressing Bathing Assistance: Limited assistance Feeding assistance: Independent Dressing Assistance: Limited assistance     Functional Limitations Info  Sight, Hearing, Speech Sight Info: Adequate Hearing Info: Adequate Speech Info: Adequate    SPECIAL CARE FACTORS FREQUENCY                       Contractures      Additional Factors Info  Code Status, Allergies Code Status Info:  (Full Code) Allergies Info:  (Lisinopril, Statins, Ciprofloxacin)             Discharge Medications: DISCHARGE MEDICATIONS:   Current Discharge Medication List    CONTINUE these medications which have  NOT CHANGED   Details  albuterol (PROVENTIL) (2.5 MG/3ML) 0.083% nebulizer solution Take 2.5 mg by nebulization every 6 (six) hours as needed for wheezing or shortness of breath.    amLODipine (NORVASC) 10 MG tablet Take 10 mg by mouth daily.    aspirin 81 MG tablet Take 81 mg by mouth daily.    benzonatate (TESSALON) 100 MG capsule Take 100 mg by mouth 3 (three) times daily as needed for cough.     busPIRone (BUSPAR) 15 MG tablet Take 15 mg by mouth 3 (three) times daily.    calcium-vitamin D (OSCAL WITH D) 500-200 MG-UNIT tablet Take 1 tablet by mouth.    Cholecalciferol 2000 units TABS Take 2,000 Units by mouth daily.    Cranberry (CRANBERRY CONCENTRATE) 500 MG CAPS Take 500 mg by mouth daily.    docusate sodium (COLACE) 100 MG capsule Take 100 mg by mouth 2 (two) times daily as needed for mild constipation.    donepezil (ARICEPT) 10 MG tablet Take 10 mg by mouth at bedtime.    fluticasone (FLONASE) 50 MCG/ACT nasal spray Place 2 sprays into both nostrils daily.     HYDROcodone-acetaminophen (NORCO/VICODIN) 5-325 MG tablet Take 1 tablet by mouth 2 (two) times daily.     lactobacillus acidophilus (BACID) TABS tablet Take 2 tablets by mouth 3 (three) times daily.    Multiple Vitamins-Minerals (ICAPS LUTEIN & ZEAXANTHIN PO) Take 1 capsule by mouth 2 (two)  times daily.    multivitamin-lutein (OCUVITE-LUTEIN) CAPS capsule Take 1 capsule by mouth daily.    omeprazole (PRILOSEC) 20 MG capsule Take 20 mg by mouth daily.    ondansetron (ZOFRAN) 4 MG tablet Take 4 mg by mouth every 8 (eight) hours as needed for nausea or vomiting.    phenazopyridine (PYRIDIUM) 200 MG tablet Take 200 mg by mouth every 8 (eight) hours as needed for pain.    pravastatin (PRAVACHOL) 20 MG tablet Take 20 mg by mouth daily.    QUEtiapine (SEROQUEL XR) 50 MG TB24 24 hr tablet Take 150 mg by mouth at bedtime.    sulfamethoxazole-trimethoprim  (BACTRIM,SEPTRA) 400-80 MG tablet Take 1 tablet by mouth 2 (two) times daily.    valsartan (DIOVAN) 320 MG tablet Take 320 mg by mouth daily.    vitamin B-12 (CYANOCOBALAMIN) 500 MCG tablet Take 500 mcg by mouth daily.      STOP taking these medications     atenolol (TENORMIN) 100 MG tablet              Relevant Imaging Results:  Relevant Lab Results:   Additional Information  (SSN [id number])  Verta Ellen Jeroline Wolbert, LCSW

## 2015-06-29 NOTE — Clinical Social Work Note (Signed)
Clinical Social Work Assessment  Patient Details  Name: Cynthia Glenn MRN: [id number] Date of Birth: 26-Jul-1920  Date of referral:  06/29/15               Reason for consult:  Discharge Planning                Permission sought to share information with:  Family Supports Permission granted to share information::  Yes, Verbal Permission Granted  Name::        Agency::     Relationship::   (Daughter- [name] )  Sport and exercise psychologist Information:     Housing/Transportation Living arrangements for the past 2 months:  [place] ([facility]) Source of Information:  Patient, Adult Children Patient Interpreter Needed:  None Criminal Activity/Legal Involvement Pertinent to Current Situation/Hospitalization:  No - Comment as needed Significant Relationships:  Adult Children, Other Family Members Lives with:  Facility Resident ([facility]) Do you feel safe going back to the place where you live?  Yes Need for family participation in patient care:  Yes (Comment) [name]- Daughter)  Care giving concerns:  Patient is from the [facility].    Social Worker assessment / plan:  CSW was informed that patient is a resident at the [facility]. CSW met with patient and her daughter [name] at bedside. CSW introduced herself and her role. Per [name] patient is from the [facility] and plans to return at discharge. Reports that patient uses a walker and is pretty independent. Stated she'll transport patient at discharge. Granted CSW verbal permission to contact [facility] to discuss discharge plans.  CSW spoke with Janett Billow at the [employer]. Per Janett Billow she reports that patient is independent with ADLs. She stated that sometimes patient needs assistance with putting on her clothes but usually she does well. Reports that patient uses a walker. Reports that patient can return at discharge. CSW informed her that patient is medically stable to discharge today.   Clinical Social Worker was informed by that patient will be  medically ready to discharge to [facility]. Patient and her daughter are in a agreement with plan. CSW called [facility] to confirm that patient's bed is ready. All discharge information faxed to [facility].    Employment status:  Retired Forensic scientist:  Medicare PT Recommendations:  Not assessed at this time Information / Referral to community resources:     Patient/Family's Response to care:  Patient and her daughter are in agreement for patient to return to [facility].   Patient/Family's Understanding of and Emotional Response to Diagnosis, Current Treatment, and Prognosis:  Patient and her family understand why patient was admitted and CSW's role. They are appreciative of CSW's assistance.   Emotional Assessment Appearance:  Appears stated age Attitude/Demeanor/Rapport:   (None) Affect (typically observed):  Accepting, Calm, Pleasant Orientation:  Oriented to Self, Oriented to Place Alcohol / Substance use:  Not Applicable Psych involvement (Current and /or in the community):  No (Comment)  Discharge Needs  Concerns to be addressed:  Discharge Planning Concerns Readmission within the last 30 days:  No Current discharge risk:  None Barriers to Discharge:  No Barriers Identified   [place], LCSW 06/29/2015, 2:46 PM

## 2015-06-29 NOTE — Care Management Note (Signed)
Case Management Note  Patient Details  Name: Cynthia Glenn MRN: [id number] Date of Birth: [birth year]  Subjective/Objective:   No needs identified. Case Closed.                  Action/Plan:   Expected Discharge Date:                  Expected Discharge Plan:  Assisted Living / Rest Home ([facility])  In-House Referral:  Clinical Social Work  Discharge planning Services     Post Acute Care Choice:    Choice offered to:     DME Arranged:    DME Agency:     HH Arranged:    HH Agency:     Status of Service:  Completed, signed off  Medicare Important Message Given:    Date Medicare IM Given:    Medicare IM give by:    Date Additional Medicare IM Given:    Additional Medicare Important Message give by:     If discussed at Long Length of Stay Meetings, dates discussed:    Additional Comments:  Marily MemosLisa M Kennidi Yoshida, RN 06/29/2015, 2:34 PM

## 2015-06-29 NOTE — Discharge Summary (Signed)
[facility] [place] [facility]   PATIENT NAME: Cynthia GoodpastureBetty Glenn    MR#:  [id number]  DATE OF BIRTH:  April 06, 1920  DATE OF ADMISSION:  06/28/2015 ADMITTING PHYSICIAN: Katha HammingSnehalatha Konidena, MD  DATE OF DISCHARGE: 06/29/2015  PRIMARY CARE PHYSICIAN: PROVIDER NOT IN SYSTEM    ADMISSION DIAGNOSIS:  Symptomatic bradycardia [R00.1]  DISCHARGE DIAGNOSIS:  Active Problems:   Bradycardia   SECONDARY DIAGNOSIS:   Past Medical History  Diagnosis Date  . Arthritis   . Hypertension   . Dementia   . COPD (chronic obstructive pulmonary disease) (HCC)   . Shingles   . Seasonal allergies   . Polycystic kidney disease   . Vitamin B12 deficiency   . Dementia   . PVD (peripheral vascular disease) (HCC)   . Vitamin D deficiency   . Essential tremor   . CVA (cerebral infarction)   . GERD (gastroesophageal reflux disease)   . HTN (hypertension)   . Hyperlipemia     HOSPITAL COURSE:  82 year old female with known history of hypertension, COPD, dementia, was admitted for symptomatic bradycardia.  Please see H&P dictated for further details.  #1 symptomatic sinus bradycardia with first-degree AV block followed by a bigeminy pattern with supraventricular narrow complex beat: Likely due to beta blockers: Stopped the atenolol, neg cardiac markers, echocardiogram normal #2 history of COPD: No wheezing. #3 Alzheimer's dementia:: Continue Aricept. #4 depression: Continue Cymbalta, when necessary Ativan. #5 recent UTI: Bactrim: Continue that. UA looks within normal limits  Patient doing much better and is being discharged back to her facility in stable condition.  Family is in agreement with same.  Heart rate has remained in 60s to 70s without any further symptoms. DISCHARGE CONDITIONS:  Stable CONSULTS OBTAINED:     DRUG ALLERGIES:   Allergies  Allergen Reactions  . Lisinopril Cough  . Statins Hives  . Ciprofloxacin     DISCHARGE MEDICATIONS:   Current  Discharge Medication List    CONTINUE these medications which have NOT CHANGED   Details  albuterol (PROVENTIL) (2.5 MG/3ML) 0.083% nebulizer solution Take 2.5 mg by nebulization every 6 (six) hours as needed for wheezing or shortness of breath.    amLODipine (NORVASC) 10 MG tablet Take 10 mg by mouth daily.    aspirin 81 MG tablet Take 81 mg by mouth daily.    benzonatate (TESSALON) 100 MG capsule Take 100 mg by mouth 3 (three) times daily as needed for cough.     busPIRone (BUSPAR) 15 MG tablet Take 15 mg by mouth 3 (three) times daily.    calcium-vitamin D (OSCAL WITH D) 500-200 MG-UNIT tablet Take 1 tablet by mouth.    Cholecalciferol 2000 units TABS Take 2,000 Units by mouth daily.    Cranberry (CRANBERRY CONCENTRATE) 500 MG CAPS Take 500 mg by mouth daily.    docusate sodium (COLACE) 100 MG capsule Take 100 mg by mouth 2 (two) times daily as needed for mild constipation.    donepezil (ARICEPT) 10 MG tablet Take 10 mg by mouth at bedtime.    fluticasone (FLONASE) 50 MCG/ACT nasal spray Place 2 sprays into both nostrils daily.     HYDROcodone-acetaminophen (NORCO/VICODIN) 5-325 MG tablet Take 1 tablet by mouth 2 (two) times daily.     lactobacillus acidophilus (BACID) TABS tablet Take 2 tablets by mouth 3 (three) times daily.    Multiple Vitamins-Minerals (ICAPS LUTEIN & ZEAXANTHIN PO) Take 1 capsule by mouth 2 (two) times daily.    multivitamin-lutein (OCUVITE-LUTEIN) CAPS capsule Take  1 capsule by mouth daily.    omeprazole (PRILOSEC) 20 MG capsule Take 20 mg by mouth daily.    ondansetron (ZOFRAN) 4 MG tablet Take 4 mg by mouth every 8 (eight) hours as needed for nausea or vomiting.    phenazopyridine (PYRIDIUM) 200 MG tablet Take 200 mg by mouth every 8 (eight) hours as needed for pain.    pravastatin (PRAVACHOL) 20 MG tablet Take 20 mg by mouth daily.    QUEtiapine (SEROQUEL XR) 50 MG TB24 24 hr tablet Take 150 mg by mouth at bedtime.     sulfamethoxazole-trimethoprim (BACTRIM,SEPTRA) 400-80 MG tablet Take 1 tablet by mouth 2 (two) times daily.    valsartan (DIOVAN) 320 MG tablet Take 320 mg by mouth daily.    vitamin B-12 (CYANOCOBALAMIN) 500 MCG tablet Take 500 mcg by mouth daily.      STOP taking these medications     atenolol (TENORMIN) 100 MG tablet          DISCHARGE INSTRUCTIONS:    DIET:  Cardiac diet  DISCHARGE CONDITION:  Good  ACTIVITY:  Activity as tolerated  OXYGEN:  Home Oxygen: No.   Oxygen Delivery: room air  [facility]   If you experience worsening of your admission symptoms, develop shortness of breath, life threatening emergency, suicidal or homicidal thoughts you must seek medical attention immediately by calling 911 or calling your MD immediately  if symptoms less severe.  You Must read complete instructions/literature along with all the possible adverse reactions/side effects for all the Medicines you take and that have been prescribed to you. Take any new Medicines after you have completely understood and accpet all the possible adverse reactions/side effects.   Please note  You were cared for by a hospitalist during your hospital stay. If you have any questions about your discharge medications or the care you received while you were in the hospital after you are discharged, you can call the unit and asked to speak with the hospitalist on call if the hospitalist that took care of you is not available. Once you are discharged, your primary care physician will handle any further medical issues. Please note that NO REFILLS for any discharge medications will be authorized once you are discharged, as it is imperative that you return to your primary care physician (or establish a relationship with a primary care physician if you do not have one) for your aftercare needs so that they can reassess your need for medications and monitor your lab values.    On the day of  Discharge:  VITAL SIGNS:  Blood pressure 157/57, pulse 59, temperature 97.8 F (36.6 C), temperature source Oral, resp. rate 16, height  (1.549 m), weight 72.666 kg (160 lb 3.2 oz), SpO2 98 %.  PHYSICAL EXAMINATION:  GENERAL:  82 y.o.-year-old patient lying in the bed with no acute distress.  EYES: Pupils equal, round, reactive to light and accommodation. No scleral icterus. Extraocular muscles intact.  HEENT: Head atraumatic, normocephalic. Oropharynx and nasopharynx clear.  NECK:  Supple, no jugular venous distention. No thyroid enlargement, no tenderness.  LUNGS: Normal breath sounds bilaterally, no wheezing, rales,rhonchi or crepitation. No use of accessory muscles of respiration.  CARDIOVASCULAR: S1, S2 normal. No murmurs, rubs, or gallops.  ABDOMEN: Soft, non-tender, non-distended. Bowel sounds present. No organomegaly or mass.  EXTREMITIES: No pedal edema, cyanosis, or clubbing.  NEUROLOGIC: Cranial nerves II through XII are intact. Muscle strength 5/5 in all extremities. Sensation intact. Gait not checked.  PSYCHIATRIC:  The patient is alert and oriented x 3.  SKIN: No obvious rash, lesion, or ulcer.  DATA REVIEW:   CBC  Recent Labs Lab 06/29/15 0549  WBC 4.4  HGB 13.3  HCT 38.8  PLT 181    Chemistries   Recent Labs Lab 06/28/15 1122 06/29/15 0549  NA 138 140  K 3.7 3.9  CL 105 109  CO2 29 28  GLUCOSE 88 92  BUN 19 22*  CREATININE 1.00 1.04*  CALCIUM 8.9 8.7*  AST 18  --   ALT 13*  --   ALKPHOS 74  --   BILITOT 0.5  --     Cardiac Enzymes  Recent Labs Lab 06/29/15 0549  TROPONINI <0.03    Microbiology Results  Results for orders placed or performed during the hospital encounter of 06/28/15  MRSA PCR Screening     Status: None   Collection Time: 06/28/15  4:50 PM  Result Value Ref Range Status   MRSA by PCR NEGATIVE NEGATIVE Final    Comment:        The GeneXpert MRSA Assay (FDA approved for NASAL specimens only), is one component of  a comprehensive MRSA colonization surveillance program. It is not intended to diagnose MRSA infection nor to guide or monitor treatment for MRSA infections.     RADIOLOGY:  Dg Chest Port 1 View  06/28/2015  CLINICAL DATA:  82 year old female with bradycardia. Nausea. Initial encounter. EXAM: PORTABLE CHEST 1 VIEW COMPARISON:  12/17/2014 and earlier. FINDINGS: Lung volumes are stable. Lungs remain clear when allowing for portable technique. Stable cardiac size and mediastinal contours. Visualized tracheal air column is within normal limits. No pneumothorax. Calcified aortic atherosclerosis. IMPRESSION: No acute cardiopulmonary abnormality. Electronically Signed   By: Odessa Fleming M.D.   On: 06/28/2015 11:47     Management plans discussed with the patient, family and they are in agreement.  CODE STATUS: Full code  TOTAL TIME TAKING CARE OF THIS PATIENT: 45 minutes.    [facility], Steele Stracener M.D on 06/29/2015 at 2:40 PM  Between 7am to 6pm - Pager - [phone]  After 6pm go to [email or web address] - password [email or web address]  [facility]  Office  [phone]  CC: Primary care physician; PROVIDER NOT IN SYSTEM   Note: This dictation was prepared with Dragon dictation along with smaller phrase technology. Any transcriptional errors that result from this process are unintentional.

## 2015-06-29 NOTE — Care Management Obs Status (Signed)
MEDICARE OBSERVATION STATUS NOTIFICATION   Patient Details  Name: Cynthia Glenn MRN: [id number] Date of Birth: Jul 10, 1920   Medicare Observation Status Notification Given:  Yes    Marily MemosLisa M Eliyana Pagliaro, RN 06/29/2015, 9:32 AM

## 2015-06-29 NOTE — Progress Notes (Signed)
*  PRELIMINARY RESULTS* Echocardiogram 2D Echocardiogram has been performed.  Cynthia Glenn 06/29/2015, 2:55 PM

## 2015-06-29 NOTE — Discharge Instructions (Signed)
Bradycardia  Bradycardia is a slower-than-normal heart rate. A normal resting heart rate for an adult ranges from 60 to 100 beats per minute. With bradycardia, the resting heart rate is less than 60 beats per minute.  Bradycardia is a problem if your heart cannot pump enough oxygen-rich blood through your body. Bradycardia is not a problem for everyone. For some healthy adults, a slow resting heart rate is normal.   CAUSES   Bradycardia may be caused by:  · A problem with the heart's electrical system, such as heart block.  · A problem with the heart's natural pacemaker (sinus node).  · Heart disease, damage, or infection.  · Certain medicines that treat heart conditions.  · Certain conditions, such as hypothyroidism and obstructive sleep apnea.  RISK FACTORS   Risk factors include:  · Being 65 or older.  · Having high blood pressure (hypertension), high cholesterol (hyperlipidemia), or diabetes.  · Drinking heavily, using tobacco products, or using drugs.  · Being stressed.  SIGNS AND SYMPTOMS   Signs and symptoms include:  · Light-headedness.  · Fainting or near fainting.  · Fatigue and weakness.  · Shortness of breath.  · Chest pain (angina).  · Drowsiness.  · Confusion.  · Dizziness.  DIAGNOSIS   Diagnosis of bradycardia may include:  · A physical exam.  · An electrocardiogram (ECG).  · Blood tests.  TREATMENT   Treatment for bradycardia may include:  · Treatment of an underlying condition.  · Pacemaker placement. A pacemaker is a small, battery-powered device that is placed under the skin and is programmed to sense your heartbeats. If your heart rate is lower than the programmed rate, the pacemaker will pace your heart.  · Changing your medicines or dosages.  HOME CARE INSTRUCTIONS  · Take medicines only as directed by your health care provider.  · Manage any health conditions that contribute to bradycardia as directed by your health care provider.  · Follow a heart-healthy diet. A dietitian can help educate  you on healthy food options and changes.  · Follow an exercise program approved by your health care provider.  · Maintain a healthy weight. Lose weight as approved by your health care provider.  · Do not use tobacco products, including cigarettes, chewing tobacco, or electronic cigarettes. If you need help quitting, ask your health care provider.  · Do not use illegal drugs.  · Limit alcohol intake to no more than 1 drink per day for nonpregnant women and 2 drinks per day for men. One drink equals 12 ounces of beer, 5 ounces of wine, or 1½ ounces of hard liquor.  · Keep all follow-up visits as directed by your health care provider. This is important.  SEEK MEDICAL CARE IF:  · You feel light-headed or dizzy.  · You almost faint.  · You feel weak or are easily fatigued during physical activity.  · You experience confusion or have memory problems.  SEEK IMMEDIATE MEDICAL CARE IF:   · You faint.  · You have an irregular heartbeat.  · You have chest pain.  · You have trouble breathing.  MAKE SURE YOU:   · Understand these instructions.  · Will watch your condition.  · Will get help right away if you are not doing well or get worse.     This information is not intended to replace advice given to you by your health care provider. Make sure you discuss any questions you have with your health care provider.       Document Released: 12/02/2001 Document Revised: 04/02/2014 Document Reviewed: 06/17/2013  Elsevier Interactive Patient Education ©2016 Elsevier Inc.

## 2015-07-07 ENCOUNTER — Encounter: Payer: Self-pay | Admitting: Emergency Medicine

## 2015-07-07 ENCOUNTER — Emergency Department
Admission: EM | Admit: 2015-07-07 | Discharge: 2015-07-07 | Disposition: A | Discharge status: Skilled Nursing Facility | Payer: Medicare Other | Attending: Emergency Medicine | Admitting: Emergency Medicine

## 2015-07-07 ENCOUNTER — Emergency Department: Payer: Medicare Other

## 2015-07-07 DIAGNOSIS — Y939 Activity, unspecified: Secondary | ICD-10-CM | POA: Insufficient documentation

## 2015-07-07 DIAGNOSIS — Y9289 Other specified places as the place of occurrence of the external cause: Secondary | ICD-10-CM | POA: Diagnosis not present

## 2015-07-07 DIAGNOSIS — I639 Cerebral infarction, unspecified: Secondary | ICD-10-CM | POA: Insufficient documentation

## 2015-07-07 DIAGNOSIS — I739 Peripheral vascular disease, unspecified: Secondary | ICD-10-CM | POA: Diagnosis not present

## 2015-07-07 DIAGNOSIS — S51011A Laceration without foreign body of right elbow, initial encounter: Secondary | ICD-10-CM | POA: Insufficient documentation

## 2015-07-07 DIAGNOSIS — W0110XA Fall on same level from slipping, tripping and stumbling with subsequent striking against unspecified object, initial encounter: Secondary | ICD-10-CM | POA: Diagnosis not present

## 2015-07-07 DIAGNOSIS — I10 Essential (primary) hypertension: Secondary | ICD-10-CM | POA: Diagnosis not present

## 2015-07-07 DIAGNOSIS — Z7952 Long term (current) use of systemic steroids: Secondary | ICD-10-CM | POA: Diagnosis not present

## 2015-07-07 DIAGNOSIS — K219 Gastro-esophageal reflux disease without esophagitis: Secondary | ICD-10-CM | POA: Diagnosis not present

## 2015-07-07 DIAGNOSIS — J449 Chronic obstructive pulmonary disease, unspecified: Secondary | ICD-10-CM | POA: Diagnosis not present

## 2015-07-07 DIAGNOSIS — Z7982 Long term (current) use of aspirin: Secondary | ICD-10-CM | POA: Insufficient documentation

## 2015-07-07 DIAGNOSIS — W19XXXA Unspecified fall, initial encounter: Secondary | ICD-10-CM

## 2015-07-07 DIAGNOSIS — F0390 Unspecified dementia without behavioral disturbance: Secondary | ICD-10-CM | POA: Insufficient documentation

## 2015-07-07 DIAGNOSIS — E785 Hyperlipidemia, unspecified: Secondary | ICD-10-CM | POA: Diagnosis not present

## 2015-07-07 DIAGNOSIS — Y999 Unspecified external cause status: Secondary | ICD-10-CM | POA: Insufficient documentation

## 2015-07-07 NOTE — Discharge Instructions (Signed)
Please seek medical attention for any high fevers, chest pain, shortness of breath, change in behavior, persistent vomiting, bloody stool or any other new or concerning symptoms.   Skin Tear Care A skin tear is when the top layer of skin peels off. To repair the skin, your doctor may use:   Tape.  Skin adhesive strips. HOME CARE  Change bandages (dressings) once a day or as told by your doctor.  Gently clean the area with salt (saline) solution or with a mild soap and water.  Do not rub the injured skin dry. Let the area air dry.  Put petroleum jelly or antibiotic cream on the tear. Do not allow a scab to form.  If the bandage sticks, moisten it with warm soapy water and remove it.  Protect the injured skin until it has healed.  Only take medicine as told by your doctor.  Take showers or baths using warm soapy water. Apply a new bandage after the shower or bath.  Keep all doctor visits as told. GET HELP RIGHT AWAY IF:   You have redness, puffiness (swelling), or more pain in the tear.  You have ayellowish-white fluid (pus) coming from the tear.  You have chills.  You have a red streak that goes away from the tear.  You have a bad smell coming from the tear or bandage.  You have a fever or lasting symptoms for more than 2-3 days.  You have a fever and your symptoms suddenly get worse. MAKE SURE YOU:   Understand these instructions.  Will watch this condition.  Will get help right away if you are not doing well or get worse.   This information is not intended to replace advice given to you by your health care provider. Make sure you discuss any questions you have with your health care provider.   Document Released: 12/20/2007 Document Revised: 12/05/2011 Document Reviewed: 09/24/2011 Elsevier Interactive Patient Education [employer]2016 Elsevier Inc.

## 2015-07-07 NOTE — ED Notes (Signed)
Pt arrived by EMS from the [facility], post fall. EMS reports pt was going to bathroom, pt fell into the wall, hitting her head on right side. EMS reports when they arrived pt was wedged into a corner. Pt has hematoma to the right forehead and a skin tear to the right elbow. Pt denies pain at this time.

## 2015-07-07 NOTE — ED Provider Notes (Signed)
[facility] Emergency Department Provider Note   ____________________________________________  Time seen: ~2235  I have reviewed the triage vital signs and the nursing notes.   HISTORY  Chief Complaint Fall   History limited by: Not Limited   HPI Cynthia Glenn is a 82 y.o. female who presented to the emergency department today after a fall. The patient states that she had woken up from sleep and went to the bathroom. She states on her way back to the bed she fell. She is not exactly sure how she fell or why she fell. She denies passing out. She denies any traumatic pain after the fall. She thinks she did hit her head. No loss of consciousness. She is not complaining of any neck pain. She did not have any associated chest pain, palpitations or shortness of breath. The daughter states that sometimes when she wakes up in the night she is a little disoriented and unsteady. No recent fevers.    Past Medical History  Diagnosis Date  . Arthritis   . Hypertension   . Dementia   . COPD (chronic obstructive pulmonary disease) (HCC)   . Shingles   . Seasonal allergies   . Polycystic kidney disease   . Vitamin B12 deficiency   . Dementia   . PVD (peripheral vascular disease) (HCC)   . Vitamin D deficiency   . Essential tremor   . CVA (cerebral infarction)   . GERD (gastroesophageal reflux disease)   . HTN (hypertension)   . Hyperlipemia     Patient Active Problem List   Diagnosis Date Noted  . Bradycardia 06/28/2015  . Cellulitis and abscess of neck 01/03/2015  . Submandibular abscess 01/03/2015    Past Surgical History  Procedure Laterality Date  . Hysterotomy    . Hernia repair    . Orif ankle fracture    . Cataract extraction    . Colon surgery      Current Outpatient Rx  Name  Route  Sig  Dispense  Refill  . albuterol (PROVENTIL) (2.5 MG/3ML) 0.083% nebulizer solution   Nebulization   Take 2.5 mg by nebulization every 6 (six) hours as  needed for wheezing or shortness of breath.         Marland Kitchen amLODipine (NORVASC) 10 MG tablet   Oral   Take 10 mg by mouth daily.         Marland Kitchen aspirin 81 MG tablet   Oral   Take 81 mg by mouth daily.         . benzonatate (TESSALON) 100 MG capsule   Oral   Take 100 mg by mouth 3 (three) times daily as needed for cough.          . busPIRone (BUSPAR) 15 MG tablet   Oral   Take 15 mg by mouth 3 (three) times daily.         . calcium-vitamin D (OSCAL WITH D) 500-200 MG-UNIT tablet   Oral   Take 1 tablet by mouth.         . Cholecalciferol 2000 units TABS   Oral   Take 2,000 Units by mouth daily.         . Cranberry (CRANBERRY CONCENTRATE) 500 MG CAPS   Oral   Take 500 mg by mouth daily.         Marland Kitchen docusate sodium (COLACE) 100 MG capsule   Oral   Take 100 mg by mouth 2 (two) times daily as needed for mild constipation.         Marland Kitchen  donepezil (ARICEPT) 10 MG tablet   Oral   Take 10 mg by mouth at bedtime.         . fluticasone (FLONASE) 50 MCG/ACT nasal spray   Each Nare   Place 2 sprays into both nostrils daily.          Marland Kitchen. HYDROcodone-acetaminophen (NORCO/VICODIN) 5-325 MG tablet   Oral   Take 1 tablet by mouth 2 (two) times daily.          Marland Kitchen. lactobacillus acidophilus (BACID) TABS tablet   Oral   Take 2 tablets by mouth 3 (three) times daily.         . Multiple Vitamins-Minerals (ICAPS LUTEIN & ZEAXANTHIN PO)   Oral   Take 1 capsule by mouth 2 (two) times daily.         . multivitamin-lutein (OCUVITE-LUTEIN) CAPS capsule   Oral   Take 1 capsule by mouth daily.         Marland Kitchen. omeprazole (PRILOSEC) 20 MG capsule   Oral   Take 20 mg by mouth daily.         . ondansetron (ZOFRAN) 4 MG tablet   Oral   Take 4 mg by mouth every 8 (eight) hours as needed for nausea or vomiting.         . phenazopyridine (PYRIDIUM) 200 MG tablet   Oral   Take 200 mg by mouth every 8 (eight) hours as needed for pain.         . pravastatin (PRAVACHOL) 20 MG  tablet   Oral   Take 20 mg by mouth daily.         . QUEtiapine (SEROQUEL XR) 50 MG TB24 24 hr tablet   Oral   Take 150 mg by mouth at bedtime.         . valsartan (DIOVAN) 320 MG tablet   Oral   Take 320 mg by mouth daily.         . vitamin B-12 (CYANOCOBALAMIN) 500 MCG tablet   Oral   Take 500 mcg by mouth daily.           Allergies Lisinopril; Statins; and Ciprofloxacin  Family History  Problem Relation Age of Onset  . Hypertension      Social History Social History  Substance Use Topics  . Smoking status: Never Smoker   . Smokeless tobacco: None  . Alcohol Use: No    Review of Systems  Constitutional: Negative for fever. Cardiovascular: Negative for chest pain. Respiratory: Negative for shortness of breath. Gastrointestinal: Negative for abdominal pain, vomiting and diarrhea. Neurological: Negative for headaches, focal weakness or numbness.  10-point ROS otherwise negative.  ____________________________________________   PHYSICAL EXAM:  VITAL SIGNS: ED Triage Vitals  Enc Vitals Group     BP 07/07/15 2219 155/57 mmHg     Pulse Rate 07/07/15 2142 93     Resp 07/07/15 2142 15     Temp 07/07/15 2142 97.8 F (36.6 C)     Temp Source 07/07/15 2142 Oral     SpO2 07/07/15 2134 98 %     Weight 07/07/15 2142 156 lb (70.761 kg)     Height 07/07/15 2142 5\' 5"  (1.651 m)     Head Cir --      Peak Flow --      Pain Score 07/07/15 2311 3   Constitutional: Alert and oriented. Well appearing and in no distress. Eyes: Conjunctivae are normal. PERRL. Normal extraocular movements. ENT   Head: Normocephalic and atraumatic.  Nose: No congestion/rhinnorhea.   Mouth/Throat: Mucous membranes are moist.   Neck: No stridor.No midline tenderness Hematological/Lymphatic/Immunilogical: No cervical lymphadenopathy. Cardiovascular: Normal rate, regular rhythm.  No murmurs, rubs, or gallops. Respiratory: Normal respiratory effort without tachypnea nor  retractions. Breath sounds are clear and equal bilaterally. No wheezes/rales/rhonchi. Gastrointestinal: Soft and nontender. No distention.  Genitourinary: Deferred Musculoskeletal: Normal range of motion in all extremities. No joint effusions.  No lower extremity tenderness nor edema. No upper extremity tenderness nor edema. No obvious deformities. No hip tenderness. No spinal tenderness. Neurologic:  Normal speech and language. No gross focal neurologic deficits are appreciated.  Skin:  Skin is warm, dry and intact. Small skin tear noted to right forearm. Hemostatic. No tenderness over the area. Psychiatric: Mood and affect are normal. Speech and behavior are normal. Patient exhibits appropriate insight and judgment.  ____________________________________________    LABS (pertinent positives/negatives)  None  ____________________________________________   EKG  None  ____________________________________________    RADIOLOGY  CT head/cervical spine  IMPRESSION: 1. No evidence of traumatic intracranial injury or fracture. 2. No evidence of acute fracture or subluxation along the cervical spine. 3. Mild to moderate cortical volume loss and scattered small vessel ischemic microangiopathy. 4. Small chronic lacunar infarcts suggested at the basal ganglia. 5. Mild degenerative change along the cervical spine. 6. Calcification at the carotid bifurcations bilaterally. Carotid ultrasound would be helpful for further evaluation, when and as deemed clinically appropriate. 7. Apparent 2.8 cm nodule noted adjacent to the left thyroid lobe. Consider further evaluation with thyroid ultrasound. If patient is clinically hyperthyroid, consider nuclear medicine thyroid uptake and scan.  ____________________________________________   PROCEDURES  Procedure(s) performed: None  Critical Care performed: No  ____________________________________________   INITIAL IMPRESSION / ASSESSMENT  AND PLAN / ED COURSE  Pertinent labs & imaging results that were available during my care of the patient were reviewed by me and considered in my medical decision making (see chart for details).  Patient presented to the emergency department today after a fall. Unclear quite the circumstances surrounding the fall. Patient without a significant traumatic injuries. CT head and cervical spine negative. I did have a discussion with the patient and family that we could do further workup however they declined at this time. They feel comfortable that it was likely a fall giving that she is unbalanced when she wakes up at night.  ____________________________________________   FINAL CLINICAL IMPRESSION(S) / ED DIAGNOSES  Final diagnoses:  Fall, initial encounter  Skin tear of elbow without complication, right, initial encounter     Phineas Semen, MD 07/08/15 0001

## 2016-06-20 ENCOUNTER — Encounter: Payer: Self-pay | Admitting: Emergency Medicine

## 2016-06-20 ENCOUNTER — Emergency Department: Payer: Medicare Other

## 2016-06-20 ENCOUNTER — Emergency Department
Admission: EM | Admit: 2016-06-20 | Discharge: 2016-06-21 | Disposition: A | Discharge status: Home / Self Care | Payer: Medicare Other | Attending: Student in an Organized Health Care Education/Training Program | Admitting: Student in an Organized Health Care Education/Training Program

## 2016-06-20 DIAGNOSIS — I10 Essential (primary) hypertension: Secondary | ICD-10-CM | POA: Insufficient documentation

## 2016-06-20 DIAGNOSIS — J449 Chronic obstructive pulmonary disease, unspecified: Secondary | ICD-10-CM | POA: Diagnosis not present

## 2016-06-20 DIAGNOSIS — W19XXXA Unspecified fall, initial encounter: Secondary | ICD-10-CM

## 2016-06-20 DIAGNOSIS — Y929 Unspecified place or not applicable: Secondary | ICD-10-CM | POA: Diagnosis not present

## 2016-06-20 DIAGNOSIS — Y999 Unspecified external cause status: Secondary | ICD-10-CM | POA: Insufficient documentation

## 2016-06-20 DIAGNOSIS — F0390 Unspecified dementia without behavioral disturbance: Secondary | ICD-10-CM | POA: Diagnosis not present

## 2016-06-20 DIAGNOSIS — W050XXA Fall from non-moving wheelchair, initial encounter: Secondary | ICD-10-CM | POA: Insufficient documentation

## 2016-06-20 DIAGNOSIS — Y939 Activity, unspecified: Secondary | ICD-10-CM | POA: Diagnosis not present

## 2016-06-20 NOTE — ED Provider Notes (Addendum)
[facility] Emergency Department Provider Note    First MD Initiated Contact with Patient 06/20/16 2234     (approximate)  I have reviewed the triage vital signs and the nursing notes.   HISTORY  Chief Complaint Fall  Level V Caveat:  dementia  HPI Cynthia Glenn is a 83 y.o. female with severe dementia presents from [place] via EMS after staff reported seeing patients slide out of her wheelchair and hit the ground. Staff reported that she is roughly at her baseline cognition level. Patient arrives in no acute distress. History limited due to dementia. Patient only states "I want to go back."   Past Medical History:  Diagnosis Date  . Arthritis   . COPD (chronic obstructive pulmonary disease) (HCC)   . CVA (cerebral infarction)   . Dementia   . Dementia   . Essential tremor   . GERD (gastroesophageal reflux disease)   . HTN (hypertension)   . Hyperlipemia   . Hypertension   . Polycystic kidney disease   . PVD (peripheral vascular disease) (HCC)   . Seasonal allergies   . Shingles   . Vitamin B12 deficiency   . Vitamin D deficiency    Family History  Problem Relation Age of Onset  . Hypertension     Past Surgical History:  Procedure Laterality Date  . CATARACT EXTRACTION    . COLON SURGERY    . HERNIA REPAIR    . HYSTEROTOMY    . ORIF ANKLE FRACTURE     Patient Active Problem List   Diagnosis Date Noted  . Bradycardia 06/28/2015  . Cellulitis and abscess of neck 01/03/2015  . Submandibular abscess 01/03/2015      Prior to Admission medications   Medication Sig Start Date End Date Taking? Authorizing Provider  albuterol (PROVENTIL) (2.5 MG/3ML) 0.083% nebulizer solution Take 2.5 mg by nebulization every 6 (six) hours as needed for wheezing or shortness of breath.    Historical Provider, MD  amLODipine (NORVASC) 10 MG tablet Take 10 mg by mouth daily.    Historical Provider, MD  aspirin 81 MG tablet Take 81 mg by mouth  daily.    Historical Provider, MD  benzonatate (TESSALON) 100 MG capsule Take 100 mg by mouth 3 (three) times daily as needed for cough.     Historical Provider, MD  busPIRone (BUSPAR) 15 MG tablet Take 15 mg by mouth 3 (three) times daily.    Historical Provider, MD  calcium-vitamin D (OSCAL WITH D) 500-200 MG-UNIT tablet Take 1 tablet by mouth.    Historical Provider, MD  Cholecalciferol 2000 units TABS Take 2,000 Units by mouth daily.    Historical Provider, MD  Cranberry (CRANBERRY CONCENTRATE) 500 MG CAPS Take 500 mg by mouth daily.    Historical Provider, MD  docusate sodium (COLACE) 100 MG capsule Take 100 mg by mouth 2 (two) times daily as needed for mild constipation.    Historical Provider, MD  donepezil (ARICEPT) 10 MG tablet Take 10 mg by mouth at bedtime.    Historical Provider, MD  fluticasone (FLONASE) 50 MCG/ACT nasal spray Place 2 sprays into both nostrils daily.     Historical Provider, MD  HYDROcodone-acetaminophen (NORCO/VICODIN) 5-325 MG tablet Take 1 tablet by mouth 2 (two) times daily.     Historical Provider, MD  lactobacillus acidophilus (BACID) TABS tablet Take 2 tablets by mouth 3 (three) times daily.    Historical Provider, MD  Multiple Vitamins-Minerals (ICAPS LUTEIN & ZEAXANTHIN PO) Take 1  capsule by mouth 2 (two) times daily.    Historical Provider, MD  multivitamin-lutein (OCUVITE-LUTEIN) CAPS capsule Take 1 capsule by mouth daily.    Historical Provider, MD  omeprazole (PRILOSEC) 20 MG capsule Take 20 mg by mouth daily.    Historical Provider, MD  ondansetron (ZOFRAN) 4 MG tablet Take 4 mg by mouth every 8 (eight) hours as needed for nausea or vomiting.    Historical Provider, MD  phenazopyridine (PYRIDIUM) 200 MG tablet Take 200 mg by mouth every 8 (eight) hours as needed for pain.    Historical Provider, MD  pravastatin (PRAVACHOL) 20 MG tablet Take 20 mg by mouth daily.    Historical Provider, MD  QUEtiapine (SEROQUEL XR) 50 MG TB24 24 hr tablet Take 150 mg by  mouth at bedtime.    Historical Provider, MD  valsartan (DIOVAN) 320 MG tablet Take 320 mg by mouth daily.    Historical Provider, MD  vitamin B-12 (CYANOCOBALAMIN) 500 MCG tablet Take 500 mcg by mouth daily.    Historical Provider, MD    Allergies Lisinopril; Statins; and Ciprofloxacin    Social History Social History  Substance Use Topics  . Smoking status: Never Smoker  . Smokeless tobacco: Not on file  . Alcohol use No    Review of Systems Patient denies headaches, rhinorrhea, blurry vision, numbness, shortness of breath, chest pain, edema, cough, abdominal pain, nausea, vomiting, diarrhea, dysuria, fevers, rashes or hallucinations unless otherwise stated above in HPI. ____________________________________________   PHYSICAL EXAM:  VITAL SIGNS: Vitals:   06/20/16 2229  BP: (!) 124/45  Pulse: (!) 51  Resp: 18  Temp: 97.7 F (36.5 C)    Constitutional: Alert, chronically ill and frail appearing but in no acute distress. Eyes: Conjunctivae are normal. PERRL. EOMI. Head: Atraumatic. Nose: No congestion/rhinnorhea. Mouth/Throat: Mucous membranes are moist.  Oropharynx non-erythematous. Neck: No stridor.  Hematological/Lymphatic/Immunilogical: No cervical lymphadenopathy. Cardiovascular: Normal rate, regular rhythm. Grossly normal heart sounds.  Good peripheral circulation. Respiratory: Normal respiratory effort.  No retractions. Lungs CTAB. Gastrointestinal: Soft and nontender. No distention. No abdominal bruits. No CVA tenderness. Musculoskeletal: No lower extremity tenderness nor edema.  No joint effusions. Neurologic:  Will move BUE spontaneously.  No clonus.  No facial droop.  Does not follow commands relaibly Skin:  Skin is warm, dry and intact. No rash noted. Psychiatric: Mood and affect are normal. Speech and behavior are normal.  ____________________________________________   LABS (all labs ordered are listed, but only abnormal results are displayed)  No  results found for this or any previous visit (from the past 24 hour(s)). ____________________________________________  EKG_________________________________  RADIOLOGY  I personally reviewed all radiographic images ordered to evaluate for the above acute complaints and reviewed radiology reports and findings.  These findings were personally discussed with the patient.  Please see medical record for radiology report.  ____________________________________________   PROCEDURES  Procedure(s) performed:  Procedures    Critical Care performed: no ____________________________________________   INITIAL IMPRESSION / ASSESSMENT AND PLAN / ED COURSE  Pertinent labs & imaging results that were available during my care of the patient were reviewed by me and considered in my medical decision making (see chart for details).  DDX: ich, contusion, dementia  Cynthia Glenn is a 83 y.o. who presents to the ED with report of falling out a wheelchair. Patient in no acute distress. Afebrile and hemodynamic stable. This was a witnessed fall. There is no LOC. Patient with baseline dementia. CT imaging of the head and neck ordered to  evaluate for acute traumatic injury shows no acute abnormality. No other complaints at this point no other report of change in behavior do not feel further diagnostic imaging or testing clinically indicated. A spoke with patient's son at bedside regarding testing and he agrees that they have no other concerns at this time. Patient stable for discharge back to facility.      ____________________________________________   FINAL CLINICAL IMPRESSION(S) / ED DIAGNOSES  Final diagnoses:  Fall, initial encounter  Dementia without behavioral disturbance, unspecified dementia type      NEW MEDICATIONS STARTED DURING THIS VISIT:  New Prescriptions   No medications on file     Note:  This document was prepared using Dragon voice recognition software and may include  unintentional dictation errors.    Willy EddyPatrick Ebenezer Mccaskey, MD 06/20/16 [phone]    Willy EddyPatrick Kierstin January, MD 06/20/16 [phone]

## 2016-06-20 NOTE — ED Triage Notes (Signed)
Patient arrived from [facility] via [facility]. Staff report seeing patient "slide out of wheelchair to the ground." Report patient is at baseline cognition. Patient has dementia at baseline and is disoriented x4. NAD noted upon arrival to ED.

## 2016-06-20 NOTE — Discharge Instructions (Signed)
Follow up with pcp.  Return for worsening symptoms, concerns.

## 2016-06-29 ENCOUNTER — Emergency Department: Payer: Medicare Other

## 2016-06-29 ENCOUNTER — Inpatient Hospital Stay
Admission: EM | Admit: 2016-06-29 | Discharge: 2016-07-02 | DRG: 690 | Disposition: A | Discharge status: Nursing Home (Includes ICF) | Payer: Medicare Other | Attending: Internal Medicine | Admitting: Internal Medicine

## 2016-06-29 DIAGNOSIS — J449 Chronic obstructive pulmonary disease, unspecified: Secondary | ICD-10-CM | POA: Diagnosis present

## 2016-06-29 DIAGNOSIS — Z79891 Long term (current) use of opiate analgesic: Secondary | ICD-10-CM

## 2016-06-29 DIAGNOSIS — I739 Peripheral vascular disease, unspecified: Secondary | ICD-10-CM | POA: Diagnosis present

## 2016-06-29 DIAGNOSIS — E538 Deficiency of other specified B group vitamins: Secondary | ICD-10-CM | POA: Diagnosis present

## 2016-06-29 DIAGNOSIS — N3000 Acute cystitis without hematuria: Secondary | ICD-10-CM | POA: Diagnosis not present

## 2016-06-29 DIAGNOSIS — Z66 Do not resuscitate: Secondary | ICD-10-CM | POA: Diagnosis present

## 2016-06-29 DIAGNOSIS — N390 Urinary tract infection, site not specified: Secondary | ICD-10-CM

## 2016-06-29 DIAGNOSIS — R4182 Altered mental status, unspecified: Secondary | ICD-10-CM | POA: Diagnosis not present

## 2016-06-29 DIAGNOSIS — E785 Hyperlipidemia, unspecified: Secondary | ICD-10-CM | POA: Diagnosis present

## 2016-06-29 DIAGNOSIS — F05 Delirium due to known physiological condition: Secondary | ICD-10-CM | POA: Diagnosis present

## 2016-06-29 DIAGNOSIS — Z7951 Long term (current) use of inhaled steroids: Secondary | ICD-10-CM

## 2016-06-29 DIAGNOSIS — K219 Gastro-esophageal reflux disease without esophagitis: Secondary | ICD-10-CM | POA: Diagnosis present

## 2016-06-29 DIAGNOSIS — Z79899 Other long term (current) drug therapy: Secondary | ICD-10-CM

## 2016-06-29 DIAGNOSIS — R410 Disorientation, unspecified: Secondary | ICD-10-CM

## 2016-06-29 DIAGNOSIS — F0281 Dementia in other diseases classified elsewhere with behavioral disturbance: Secondary | ICD-10-CM | POA: Diagnosis present

## 2016-06-29 DIAGNOSIS — I10 Essential (primary) hypertension: Secondary | ICD-10-CM | POA: Diagnosis present

## 2016-06-29 DIAGNOSIS — Z7982 Long term (current) use of aspirin: Secondary | ICD-10-CM

## 2016-06-29 DIAGNOSIS — Z8673 Personal history of transient ischemic attack (TIA), and cerebral infarction without residual deficits: Secondary | ICD-10-CM

## 2016-06-29 DIAGNOSIS — E876 Hypokalemia: Secondary | ICD-10-CM | POA: Diagnosis present

## 2016-06-29 DIAGNOSIS — G309 Alzheimer's disease, unspecified: Secondary | ICD-10-CM | POA: Diagnosis present

## 2016-06-29 LAB — URINALYSIS, COMPLETE (UACMP) WITH MICROSCOPIC
Bilirubin Urine: NEGATIVE
GLUCOSE, UA: NEGATIVE mg/dL
KETONES UR: 20 mg/dL — AB
Nitrite: POSITIVE — AB
PH: 5 (ref 5.0–8.0)
Protein, ur: 100 mg/dL — AB
SPECIFIC GRAVITY, URINE: 1.019 (ref 1.005–1.030)

## 2016-06-29 LAB — COMPREHENSIVE METABOLIC PANEL
ALT: 6 U/L — ABNORMAL LOW (ref 14–54)
ANION GAP: 11 (ref 5–15)
AST: 23 U/L (ref 15–41)
Albumin: 4.1 g/dL (ref 3.5–5.0)
Alkaline Phosphatase: 46 U/L (ref 38–126)
BUN: 13 mg/dL (ref 6–20)
CHLORIDE: 106 mmol/L (ref 101–111)
CO2: 26 mmol/L (ref 22–32)
Calcium: 9.4 mg/dL (ref 8.9–10.3)
Creatinine, Ser: 0.93 mg/dL (ref 0.44–1.00)
GFR calc non Af Amer: 51 mL/min — ABNORMAL LOW (ref >60)
GFR, EST AFRICAN AMERICAN: 60 mL/min — AB (ref >60)
Glucose, Bld: 108 mg/dL — ABNORMAL HIGH (ref 65–99)
Potassium: 2.9 mmol/L — ABNORMAL LOW (ref 3.5–5.1)
SODIUM: 143 mmol/L (ref 135–145)
Total Bilirubin: 0.9 mg/dL (ref 0.3–1.2)
Total Protein: 6.7 g/dL (ref 6.5–8.1)

## 2016-06-29 LAB — CBC WITH DIFFERENTIAL/PLATELET
BASOS PCT: 1 %
Basophils Absolute: 0 10*3/uL (ref 0–0.1)
EOS ABS: 0.1 10*3/uL (ref 0–0.7)
EOS PCT: 1 %
HCT: 44.7 % (ref 35.0–47.0)
HEMOGLOBIN: 15.3 g/dL (ref 12.0–16.0)
LYMPHS ABS: 1.5 10*3/uL (ref 1.0–3.6)
Lymphocytes Relative: 21 %
MCH: 31.5 pg (ref 26.0–34.0)
MCHC: 34.3 g/dL (ref 32.0–36.0)
MCV: 91.8 fL (ref 80.0–100.0)
MONOS PCT: 8 %
Monocytes Absolute: 0.6 10*3/uL (ref 0.2–0.9)
NEUTROS ABS: 4.9 10*3/uL (ref 1.4–6.5)
NEUTROS PCT: 69 %
PLATELETS: 203 10*3/uL (ref 150–440)
RBC: 4.87 MIL/uL (ref 3.80–5.20)
RDW: 13.3 % (ref 11.5–14.5)
WBC: 7.1 10*3/uL (ref 3.6–11.0)

## 2016-06-29 LAB — MRSA PCR SCREENING: MRSA BY PCR: NEGATIVE

## 2016-06-29 LAB — PROCALCITONIN

## 2016-06-29 MED ORDER — DOCUSATE SODIUM 100 MG PO CAPS
100.0000 mg | ORAL_CAPSULE | Freq: Two times a day (BID) | ORAL | Status: DC
  Administered 2016-06-29 – 2016-07-02 (×4): 100 mg via ORAL
  Filled 2016-06-29 (×5): qty 1

## 2016-06-29 MED ORDER — ACETAMINOPHEN 650 MG RE SUPP: 650.0000 mg | Freq: Four times a day (QID) | RECTAL | Status: DC | PRN

## 2016-06-29 MED ORDER — LORAZEPAM 2 MG/ML IJ SOLN: 1.0000 mg | Freq: Four times a day (QID) | INTRAMUSCULAR | Status: DC | PRN

## 2016-06-29 MED ORDER — ACETAMINOPHEN 325 MG PO TABS: 650.0000 mg | ORAL_TABLET | Freq: Four times a day (QID) | ORAL | Status: DC | PRN

## 2016-06-29 MED ORDER — DEXTROSE 5 % IV SOLN: 1.0000 g | Freq: Once | INTRAVENOUS | Status: DC

## 2016-06-29 MED ORDER — FLUTICASONE PROPIONATE 50 MCG/ACT NA SUSP
2.0000 | Freq: Every day | NASAL | Status: DC
  Filled 2016-06-29: qty 16

## 2016-06-29 MED ORDER — TRAZODONE HCL 50 MG PO TABS
25.0000 mg | ORAL_TABLET | Freq: Every evening | ORAL | Status: DC | PRN
  Administered 2016-06-29: 25 mg via ORAL
  Filled 2016-06-29: qty 1

## 2016-06-29 MED ORDER — ONDANSETRON HCL 4 MG PO TABS: 4.0000 mg | ORAL_TABLET | Freq: Four times a day (QID) | ORAL | Status: DC | PRN

## 2016-06-29 MED ORDER — CYANOCOBALAMIN 500 MCG PO TABS
500.0000 ug | ORAL_TABLET | Freq: Every day | ORAL | Status: DC
  Administered 2016-06-29 – 2016-07-02 (×3): 500 ug via ORAL
  Filled 2016-06-29 (×3): qty 1

## 2016-06-29 MED ORDER — HALOPERIDOL LACTATE 5 MG/ML IJ SOLN
INTRAMUSCULAR | Status: AC
  Administered 2016-06-29: 5 mg via INTRAMUSCULAR
  Filled 2016-06-29: qty 1

## 2016-06-29 MED ORDER — HALOPERIDOL LACTATE 5 MG/ML IJ SOLN
5.0000 mg | Freq: Once | INTRAMUSCULAR | Status: AC
  Administered 2016-06-29: 5 mg via INTRAMUSCULAR
  Filled 2016-06-29: qty 1

## 2016-06-29 MED ORDER — CEFTRIAXONE SODIUM-DEXTROSE 1-3.74 GM-% IV SOLR
1.0000 g | Freq: Once | INTRAVENOUS | Status: AC
  Administered 2016-06-29: 1 g via INTRAVENOUS
  Filled 2016-06-29: qty 50

## 2016-06-29 MED ORDER — RISAQUAD PO CAPS
2.0000 | ORAL_CAPSULE | Freq: Three times a day (TID) | ORAL | Status: DC
  Administered 2016-06-29 – 2016-07-01 (×3): 2 via ORAL
  Filled 2016-06-29 (×3): qty 2

## 2016-06-29 MED ORDER — BISACODYL 5 MG PO TBEC: 5.0000 mg | DELAYED_RELEASE_TABLET | Freq: Every day | ORAL | Status: DC | PRN

## 2016-06-29 MED ORDER — POTASSIUM CHLORIDE CRYS ER 20 MEQ PO TBCR
40.0000 meq | EXTENDED_RELEASE_TABLET | ORAL | Status: AC
  Administered 2016-06-29: 40 meq via ORAL
  Filled 2016-06-29: qty 2

## 2016-06-29 MED ORDER — DEXTROSE 5 % IV SOLN
1.0000 g | INTRAVENOUS | Status: DC
  Administered 2016-06-30 – 2016-07-01 (×2): 1 g via INTRAVENOUS
  Filled 2016-06-29 (×3): qty 10

## 2016-06-29 MED ORDER — ASPIRIN EC 81 MG PO TBEC
81.0000 mg | DELAYED_RELEASE_TABLET | Freq: Every day | ORAL | Status: DC
  Administered 2016-06-30 – 2016-07-02 (×3): 81 mg via ORAL
  Filled 2016-06-29 (×3): qty 1

## 2016-06-29 MED ORDER — DIPHENHYDRAMINE HCL 50 MG/ML IJ SOLN
50.0000 mg | Freq: Once | INTRAMUSCULAR | Status: DC
  Filled 2016-06-29: qty 1

## 2016-06-29 MED ORDER — LORAZEPAM 2 MG/ML IJ SOLN
2.0000 mg | Freq: Once | INTRAMUSCULAR | Status: AC
  Administered 2016-06-29: 2 mg via INTRAMUSCULAR
  Filled 2016-06-29: qty 1

## 2016-06-29 MED ORDER — HALOPERIDOL LACTATE 5 MG/ML IJ SOLN
5.0000 mg | Freq: Once | INTRAMUSCULAR | Status: AC
  Administered 2016-06-29: 5 mg via INTRAMUSCULAR

## 2016-06-29 MED ORDER — DONEPEZIL HCL 5 MG PO TABS
10.0000 mg | ORAL_TABLET | Freq: Every day | ORAL | Status: DC
  Administered 2016-06-29 – 2016-07-01 (×3): 10 mg via ORAL
  Filled 2016-06-29 (×3): qty 2

## 2016-06-29 MED ORDER — ONDANSETRON HCL 4 MG/2ML IJ SOLN: 4.0000 mg | Freq: Four times a day (QID) | INTRAMUSCULAR | Status: DC | PRN

## 2016-06-29 MED ORDER — POTASSIUM CHLORIDE 2 MEQ/ML IV SOLN
30.0000 meq | Freq: Once | INTRAVENOUS | Status: AC
  Administered 2016-06-29: 30 meq via INTRAVENOUS
  Filled 2016-06-29: qty 15

## 2016-06-29 MED ORDER — DEXTROSE 5 % IV SOLN: 1.0000 g | INTRAVENOUS | Status: DC

## 2016-06-29 MED ORDER — AMLODIPINE BESYLATE 10 MG PO TABS
10.0000 mg | ORAL_TABLET | Freq: Every day | ORAL | Status: DC
  Administered 2016-07-01 – 2016-07-02 (×2): 10 mg via ORAL
  Filled 2016-06-29 (×3): qty 1

## 2016-06-29 MED ORDER — HALOPERIDOL LACTATE 5 MG/ML IJ SOLN
5.0000 mg | Freq: Once | INTRAMUSCULAR | Status: DC
  Filled 2016-06-29: qty 1

## 2016-06-29 MED ORDER — MEMANTINE HCL 5 MG PO TABS
10.0000 mg | ORAL_TABLET | Freq: Two times a day (BID) | ORAL | Status: DC
  Administered 2016-06-29 – 2016-07-02 (×5): 10 mg via ORAL
  Filled 2016-06-29 (×6): qty 2

## 2016-06-29 MED ORDER — HALOPERIDOL LACTATE 5 MG/ML IJ SOLN
5.0000 mg | Freq: Once | INTRAMUSCULAR | Status: AC
  Administered 2016-06-29: 5 mg via INTRAVENOUS

## 2016-06-29 MED ORDER — DIPHENHYDRAMINE HCL 50 MG/ML IJ SOLN
50.0000 mg | Freq: Once | INTRAMUSCULAR | Status: AC
  Administered 2016-06-29: 50 mg via INTRAVENOUS

## 2016-06-29 MED ORDER — HEPARIN SODIUM (PORCINE) 5000 UNIT/ML IJ SOLN
5000.0000 [IU] | Freq: Three times a day (TID) | INTRAMUSCULAR | Status: DC
  Administered 2016-06-29 – 2016-06-30 (×2): 5000 [IU] via SUBCUTANEOUS
  Filled 2016-06-29 (×2): qty 1

## 2016-06-29 MED ORDER — ATENOLOL 50 MG PO TABS
50.0000 mg | ORAL_TABLET | Freq: Every day | ORAL | Status: DC
  Administered 2016-07-01 – 2016-07-02 (×2): 50 mg via ORAL
  Filled 2016-06-29 (×3): qty 1

## 2016-06-29 MED ORDER — ACETAMINOPHEN 500 MG PO TABS
500.0000 mg | ORAL_TABLET | Freq: Two times a day (BID) | ORAL | Status: DC
  Administered 2016-06-29 – 2016-07-02 (×6): 500 mg via ORAL
  Filled 2016-06-29 (×6): qty 1

## 2016-06-29 MED ORDER — ASPIRIN 81 MG PO TABS: 81.0000 mg | ORAL_TABLET | Freq: Every day | ORAL | Status: DC

## 2016-06-29 NOTE — ED Provider Notes (Signed)
Lurline Idol, attending physician, personally viewed and interpreted this EKG  EKG Time: 1557 Rate: 68 Rhythm: sinus rhythm Axis: left axis deviation Intervals: qtc 523 QRS: narrow ST changes: no st elevation Impression: abnormal ekg   ----------------------------------------- 5:03 PM on 06/29/2016 -----------------------------------------  Patient did become more calm after Haldol and Benadryl. Urine does show signs of continued urinary tract infection. CT scan of the head without concerning findings. CXR without concerning findings. At this point think delirium secondary to UTI likely. Will plan on giving IV abx and admission.     Phineas Semen, MD 06/29/16 [phone]

## 2016-06-29 NOTE — ED Provider Notes (Signed)
[facility] Emergency Department Provider Note  ____________________________________________   First MD Initiated Contact with Patient 06/29/16 1352     (approximate)  I have reviewed the triage vital signs and the nursing notes.   HISTORY  Chief Complaint Altered Mental Status  History Limited by the patient's altered mental status  HPI Cynthia Glenn is a 83 y.o. female who comes to the emergency department via EMS for altered mental status and agitation. According to EMS the patient was recently diagnosed with a urinary tract infection, but at her home she has become increasingly agitated and uncooperative and is not willing to take her medications. Per document review she is supposed to be taking nitrofurantoin. The patient was so agitated and combative with EMS that she receive 2 mg of intramuscular mode as lemon rate. Further history is limited by the patient's altered mental status.   Past Medical History:  Diagnosis Date  . Arthritis   . COPD (chronic obstructive pulmonary disease) (HCC)   . CVA (cerebral infarction)   . Dementia   . Dementia   . Essential tremor   . GERD (gastroesophageal reflux disease)   . HTN (hypertension)   . Hyperlipemia   . Hypertension   . Polycystic kidney disease   . PVD (peripheral vascular disease) (HCC)   . Seasonal allergies   . Shingles   . Vitamin B12 deficiency   . Vitamin D deficiency     Patient Active Problem List   Diagnosis Date Noted  . Bradycardia 06/28/2015  . Cellulitis and abscess of neck 01/03/2015  . Submandibular abscess 01/03/2015    Past Surgical History:  Procedure Laterality Date  . CATARACT EXTRACTION    . COLON SURGERY    . HERNIA REPAIR    . HYSTEROTOMY    . ORIF ANKLE FRACTURE      Prior to Admission medications   Medication Sig Start Date End Date Taking? Authorizing Provider  acetaminophen (TYLENOL) 500 MG tablet Take 500 mg by mouth 2 (two) times daily.   Yes  Historical Provider, MD  albuterol (PROVENTIL) (2.5 MG/3ML) 0.083% nebulizer solution Take 2.5 mg by nebulization every 6 (six) hours as needed for wheezing or shortness of breath.   Yes Historical Provider, MD  amLODipine (NORVASC) 10 MG tablet Take 10 mg by mouth daily.   Yes Historical Provider, MD  aspirin 81 MG tablet Take 81 mg by mouth daily.   Yes Historical Provider, MD  atenolol (TENORMIN) 50 MG tablet Take 50 mg by mouth daily.   Yes Historical Provider, MD  busPIRone (BUSPAR) 15 MG tablet Take 15 mg by mouth 3 (three) times daily.   Yes Historical Provider, MD  calcium-vitamin D (OSCAL WITH D) 500-200 MG-UNIT tablet Take 1 tablet by mouth.   Yes Historical Provider, MD  Cholecalciferol 2000 units TABS Take 2,000 Units by mouth daily.   Yes Historical Provider, MD  Cranberry (CRANBERRY CONCENTRATE) 500 MG CAPS Take 450 mg by mouth 2 (two) times daily.    Yes Historical Provider, MD  docusate sodium (COLACE) 100 MG capsule Take 100 mg by mouth 2 (two) times daily as needed for mild constipation.   Yes Historical Provider, MD  donepezil (ARICEPT) 10 MG tablet Take 10 mg by mouth at bedtime.   Yes Historical Provider, MD  fluticasone (FLONASE) 50 MCG/ACT nasal spray Place 2 sprays into both nostrils daily.    Yes Historical Provider, MD  guaifenesin (ROBITUSSIN) 100 MG/5ML syrup Take 200 mg by mouth 3 (  three) times daily as needed for cough.   Yes Historical Provider, MD  HYDROcodone-acetaminophen (NORCO/VICODIN) 5-325 MG tablet Take 1 tablet by mouth 2 (two) times daily.    Yes Historical Provider, MD  lactobacillus acidophilus (BACID) TABS tablet Take 2 tablets by mouth 3 (three) times daily.   Yes Historical Provider, MD  memantine (NAMENDA) 10 MG tablet Take 10 mg by mouth 2 (two) times daily.   Yes Historical Provider, MD  nitrofurantoin, macrocrystal-monohydrate, (MACROBID) 100 MG capsule Take 100 mg by mouth 2 (two) times daily. 06/28/16 07/05/16 Yes Historical Provider, MD  ondansetron  (ZOFRAN) 4 MG tablet Take 4 mg by mouth every 8 (eight) hours as needed for nausea or vomiting.   Yes Historical Provider, MD  pravastatin (PRAVACHOL) 20 MG tablet Take 20 mg by mouth daily.   Yes Historical Provider, MD  Skin Protectants, Misc. (MINERIN) CREA Apply 1 application topically 2 (two) times daily.   Yes Historical Provider, MD  valsartan (DIOVAN) 320 MG tablet Take 320 mg by mouth daily.   Yes Historical Provider, MD  vitamin B-12 (CYANOCOBALAMIN) 500 MCG tablet Take 500 mcg by mouth daily.   Yes Historical Provider, MD    Allergies Lisinopril; Statins; and Ciprofloxacin  Family History  Problem Relation Age of Onset  . Hypertension      Social History Social History  Substance Use Topics  . Smoking status: Never Smoker  . Smokeless tobacco: Never Used  . Alcohol use No    Review of Systems Level V exemption history Limited by the patient's altered mental status 10-point ROS otherwise negative.  ____________________________________________   PHYSICAL EXAM:  VITAL SIGNS: ED Triage Vitals  Enc Vitals Group     BP      Pulse      Resp      Temp      Temp src      SpO2      Weight      Height      Head Circumference      Peak Flow      Pain Score      Pain Loc      Pain Edu?      Excl. in GC?     Constitutional: Agitated, combative, delirious Eyes: PERRL EOMI. Head: Atraumatic. Nose: No congestion/rhinnorhea. Mouth/Throat: No trismus Neck: No stridor.   Respiratory: Normal respiratory effort.  No retractions.  Musculoskeletal: No lower extremity edema   Neurologic:   No gross focal neurologic deficits are appreciated. Skin:  Skin is warm, dry and intact. No rash noted. Psychiatric: Clear dementia    ____________________________________________   DIFFERENTIAL  Metabolic arrangement, urinary tract infection, dementia, delirium ____________________________________________   LABS (all labs ordered are listed, but only abnormal results are  displayed)  Labs Reviewed  URINE CULTURE  COMPREHENSIVE METABOLIC PANEL  CBC WITH DIFFERENTIAL/PLATELET  PROCALCITONIN  URINALYSIS, COMPLETE (UACMP) WITH MICROSCOPIC     __________________________________________  EKG   ____________________________________________  RADIOLOGY   ____________________________________________   PROCEDURES  Procedure(s) performed: no  Procedures  Critical Care performed: no  ____________________________________________   INITIAL IMPRESSION / ASSESSMENT AND PLAN / ED COURSE  Pertinent labs & imaging results that were available during my care of the patient were reviewed by me and considered in my medical decision making (see chart for details).  On arrival the patient is fighting staff punching and slapping staff and is uncooperative and unsafe. According to report family said this is not the patient's baseline and she is behaving  abnormally. Intramuscular haloperidol given for the patient's own safety to facilitate a medical workup searching for an organic cause of this behavior.     ----------------------------------------- 2:42 PM on 06/29/2016 -----------------------------------------  35 minutes after receiving 5 mg of haloperidol the patient is still kicking and fighting. Her son is at bedside and we discussed further medication for the patient's own safety and he agrees. 5 mg of haloperidol and 2 mg of lorazepam now. ____________________________________________  Further collateral history given by the patient's son at bedside. He says that one week ago she was diagnosed with a fungal urinary tract infection and was doing well until about 2 days ago. She says she is quite high functioning normally and this is not her normal behavior. Further care signed out to Dr. Derrill Kay.  FINAL CLINICAL IMPRESSION(S) / ED DIAGNOSES  Final diagnoses:  Delirium      NEW MEDICATIONS STARTED DURING THIS VISIT:  New Prescriptions   No  medications on file     Note:  This document was prepared using Dragon voice recognition software and may include unintentional dictation errors.     Merrily Brittle, MD 06/29/16 [phone]

## 2016-06-29 NOTE — ED Notes (Signed)
Pt given small bites of chocolate ice cream and some water to drink. Pt ate and drank both w/o incident.

## 2016-06-29 NOTE — ED Notes (Signed)
Checked brief, pt still dry.

## 2016-06-29 NOTE — ED Notes (Signed)
Pt calming down, able to obtain vitals, blood work, Publishing copy.

## 2016-06-29 NOTE — ED Triage Notes (Signed)
Pt came to ED via EMS from [facility]. PT was diagnosed with UTI, per staff pt is combative and not taking meds. Pt given  IM versed via EMS. Combative upon arrival. Unable to obtain vitals at this time.

## 2016-06-29 NOTE — ED Notes (Signed)
Pt still too agitated to obain vitals, blood, urine. Will continue to try once pt is more relaxed. MD aware of situation. Family and sitter at bedside.

## 2016-06-29 NOTE — H&P (Addendum)
[facility] [place] at [facility]   PATIENT NAME: Cynthia Glenn    MR#:  [id number]  DATE OF BIRTH:  Jul 05, 1920  DATE OF ADMISSION:  06/29/2016  PRIMARY CARE PHYSICIAN: PROVIDER NOT IN SYSTEM   REQUESTING/REFERRING PHYSICIAN: Derrill Kay  primary  physician is Dr. Daniel Nones CHIEF COMPLAINT: Altered mental status    Chief Complaint  Patient presents with  . Altered Mental Status    HISTORY OF PRESENT ILLNESS:  Cynthia Glenn  is a 83 y.o. female with a known history of Hypertension, dementia brought in because of fall worsening confusion, agitation. Diagnosed with UTI but unable to take by mouth Macrodantin given my PCP. In the ER patient required multiple doses of Haldol, Valium, Ativan because of agitation and combativeness. Sitter is at the bedside. He has UTI on UA. Increase, or after Benadryl, Haldol. Admitting patient for acute delirium with UTI, worsening dementia. Discussed the patient's condition with family, core status is DO NOT RESUSCITATE. Patient more agitated today, according to patient's daughter at her baseline she uses walker and goes to dining room and eats by herself.Marland Kitchen  PAST MEDICAL HISTORY:   Past Medical History:  Diagnosis Date  . Arthritis   . COPD (chronic obstructive pulmonary disease) (HCC)   . CVA (cerebral infarction)   . Dementia   . Dementia   . Essential tremor   . GERD (gastroesophageal reflux disease)   . HTN (hypertension)   . Hyperlipemia   . Hypertension   . Polycystic kidney disease   . PVD (peripheral vascular disease) (HCC)   . Seasonal allergies   . Shingles   . Vitamin B12 deficiency   . Vitamin D deficiency     PAST SURGICAL HISTOIRY:   Past Surgical History:  Procedure Laterality Date  . CATARACT EXTRACTION    . COLON SURGERY    . HERNIA REPAIR    . HYSTEROTOMY    . ORIF ANKLE FRACTURE      SOCIAL HISTORY:   Social History  Substance Use Topics  . Smoking status: Never Smoker  . Smokeless  tobacco: Never Used  . Alcohol use No    FAMILY HISTORY:   Family History  Problem Relation Age of Onset  . Hypertension      DRUG ALLERGIES:   Allergies  Allergen Reactions  . Lisinopril Cough  . Statins Hives  . Ciprofloxacin     REVIEW OF SYSTEMS:  Unable to obtain  review of systems because of confusion. MEDICATIONS AT HOME:   Prior to Admission medications   Medication Sig Start Date End Date Taking? Authorizing Provider  acetaminophen (TYLENOL) 500 MG tablet Take 500 mg by mouth 2 (two) times daily.   Yes Historical Provider, MD  albuterol (PROVENTIL) (2.5 MG/3ML) 0.083% nebulizer solution Take 2.5 mg by nebulization every 6 (six) hours as needed for wheezing or shortness of breath.   Yes Historical Provider, MD  amLODipine (NORVASC) 10 MG tablet Take 10 mg by mouth daily.   Yes Historical Provider, MD  aspirin 81 MG tablet Take 81 mg by mouth daily.   Yes Historical Provider, MD  atenolol (TENORMIN) 50 MG tablet Take 50 mg by mouth daily.   Yes Historical Provider, MD  busPIRone (BUSPAR) 15 MG tablet Take 15 mg by mouth 3 (three) times daily.   Yes Historical Provider, MD  calcium-vitamin D (OSCAL WITH D) 500-200 MG-UNIT tablet Take 1 tablet by mouth.   Yes Historical Provider, MD  Cholecalciferol 2000 units TABS Take 2,000  Units by mouth daily.   Yes Historical Provider, MD  Cranberry (CRANBERRY CONCENTRATE) 500 MG CAPS Take 450 mg by mouth 2 (two) times daily.    Yes Historical Provider, MD  docusate sodium (COLACE) 100 MG capsule Take 100 mg by mouth 2 (two) times daily as needed for mild constipation.   Yes Historical Provider, MD  donepezil (ARICEPT) 10 MG tablet Take 10 mg by mouth at bedtime.   Yes Historical Provider, MD  fluticasone (FLONASE) 50 MCG/ACT nasal spray Place 2 sprays into both nostrils daily.    Yes Historical Provider, MD  guaifenesin (ROBITUSSIN) 100 MG/5ML syrup Take 200 mg by mouth 3 (three) times daily as needed for cough.   Yes Historical  Provider, MD  HYDROcodone-acetaminophen (NORCO/VICODIN) 5-325 MG tablet Take 1 tablet by mouth 2 (two) times daily.    Yes Historical Provider, MD  lactobacillus acidophilus (BACID) TABS tablet Take 2 tablets by mouth 3 (three) times daily.   Yes Historical Provider, MD  memantine (NAMENDA) 10 MG tablet Take 10 mg by mouth 2 (two) times daily.   Yes Historical Provider, MD  nitrofurantoin, macrocrystal-monohydrate, (MACROBID) 100 MG capsule Take 100 mg by mouth 2 (two) times daily. 06/28/16 07/05/16 Yes Historical Provider, MD  ondansetron (ZOFRAN) 4 MG tablet Take 4 mg by mouth every 8 (eight) hours as needed for nausea or vomiting.   Yes Historical Provider, MD  pravastatin (PRAVACHOL) 20 MG tablet Take 20 mg by mouth daily.   Yes Historical Provider, MD  Skin Protectants, Misc. (MINERIN) CREA Apply 1 application topically 2 (two) times daily.   Yes Historical Provider, MD  valsartan (DIOVAN) 320 MG tablet Take 320 mg by mouth daily.   Yes Historical Provider, MD  vitamin B-12 (CYANOCOBALAMIN) 500 MCG tablet Take 500 mcg by mouth daily.   Yes Historical Provider, MD      VITAL SIGNS:  Blood pressure 119/72, pulse 71, temperature 97.4 F (36.3 C), temperature source Oral, resp. rate 16, SpO2 100 %.  PHYSICAL EXAMINATION:  GENERAL:  83 y.o.-year-old patient lying in the bed with no acute distress.  EYES: Pupils equal, round, reactive to light. No scleral icterus.  HEENT: Head atraumatic, normocephalic. Oropharynx and nasopharynx clear.  NECK:  Supple, no jugular venous distention. No thyroid enlargement, no tenderness.  LUNGS: Normal breath sounds bilaterally, no wheezing, rales,rhonchi or crepitation. No use of accessory muscles of respiration.  CARDIOVASCULAR: S1, S2 normal. No murmurs, rubs, or gallops.  ABDOMEN: Soft, nontender, nondistended. Bowel sounds present. No organomegaly or mass.  EXTREMITIES: No pedal edema, cyanosis, or clubbing.  NEUROLOGIC:Able to do full neurological exam  because of confusion. PSYCHIATRIC: Awake and agitated. SKIN: No obvious rash, lesion, or ulcer.   LABORATORY PANEL:   CBC  Recent Labs Lab 06/29/16 1532  WBC 7.1  HGB 15.3  HCT 44.7  PLT 203   ------------------------------------------------------------------------------------------------------------------  Chemistries   Recent Labs Lab 06/29/16 1532  NA 143  K 2.9*  CL 106  CO2 26  GLUCOSE 108*  BUN 13  CREATININE 0.93  CALCIUM 9.4  AST 23  ALT 6*  ALKPHOS 46  BILITOT 0.9   ------------------------------------------------------------------------------------------------------------------  Cardiac Enzymes No results for input(s): TROPONINI in the last 168 hours. ------------------------------------------------------------------------------------------------------------------  RADIOLOGY:  Ct Head Wo Contrast  Result Date: 06/29/2016 CLINICAL DATA:  Altered mental status and agitation. Urinary tract infection. EXAM: CT HEAD WITHOUT CONTRAST TECHNIQUE: Contiguous axial images were obtained from the base of the skull through the vertex without intravenous contrast. COMPARISON:  06/20/2016 FINDINGS:  Brain: No acute finding. Generalized atrophy. Chronic small-vessel ischemic changes of the hemispheric white matter, basal ganglia and thalami I. no mass lesion, hemorrhage, hydrocephalus or extra-axial collection. Vascular: There is atherosclerotic calcification of the major vessels at the base of the brain. Skull: Normal Sinuses/Orbits: Clear/ normal. Other: None significant IMPRESSION: No acute finding by CT. Atrophy and chronic small-vessel ischemic changes. No change since study of 9 days ago. Electronically Signed   By: Paulina Fusi M.D.   On: 06/29/2016 16:41   Dg Chest Port 1 View  Result Date: 06/29/2016 CLINICAL DATA:  Sepsis? History of dementia. History of COPD and hypertension. EXAM: PORTABLE CHEST 1 VIEW COMPARISON:  Chest x-rays dated 06/28/2015 and 12/17/2014.  FINDINGS: Cardiomegaly is stable. Atherosclerosis of the ectatic thoracic aorta. Lungs are clear. No pleural effusion or pneumothorax seen. Osseous structures about the chest are unremarkable. IMPRESSION: 1. No active disease.  No evidence of pneumonia or pulmonary edema. 2. Stable cardiomegaly. 3. Aortic atherosclerosis. Electronically Signed   By: Bary Richard M.D.   On: 06/29/2016 16:36    EKG:   Orders placed or performed during the hospital encounter of 06/29/16  . ED EKG 12-Lead  . ED EKG 12-Lead  . EKG 12-Lead  . EKG 12-Lead    IMPRESSION AND PLAN:   Acute delirium secondary to UTI, worsening dementia: Admitted to hospitalist service under observation, continue Rocephin, follow urine cultures, continue sitter, use Ativan 1 mg IV every 6 hours when necessary for agitation. The head unremarkable, chest x-ray didn't show any pneumonia. CBC, electrolytes are within normal limits #2 CODE STATUS DO NOT RESUSCITATE, and confirmed with the patient's daughter and son. #3 essential hypertension: Continue blood pressure meds. #4 .history of dementia: Continue Namenda, Aricept, patient now has worsening dementia in the context of UTI. Will request psychiatric consult if the patient does not improve. For hypokalemia: Replace the potassium.  All the records are reviewed and case discussed with ED provider. Management plans discussed with the patient, family and they are in agreement.  CODE STATUS: DNR  TOTAL TIME TAKING CARE OF THIS PATIENT: .    Katha Hamming M.D on 06/29/2016 at 5:29 PM  Between 7am to 6pm - Pager - [phone]  After 6pm go to [email or web address] - password [email or web address]  [facility]  Office  [phone]  CC: Primary care physician; PROVIDER NOT IN SYSTEM  Note: This dictation was prepared with Dragon dictation along with smaller phrase technology. Any transcriptional errors that result from this process are unintentional.

## 2016-06-29 NOTE — ED Notes (Signed)
Sitter at bedside monitoring pt.

## 2016-06-29 NOTE — ED Notes (Signed)
Pt still combative at this time. Family in the room, pt kicking and yelling at family.

## 2016-06-29 NOTE — ED Notes (Signed)
Attempted to call report to floor nurse; was informed the nurse is taking shift reports, will return call when done.

## 2016-06-29 NOTE — ED Notes (Signed)
Pt eating some of meal and drink

## 2016-06-29 NOTE — ED Notes (Signed)
Informed house supervisor that sitter will be needed when pt goes to floor.

## 2016-06-30 LAB — GLUCOSE, CAPILLARY: GLUCOSE-CAPILLARY: 91 mg/dL (ref 65–99)

## 2016-06-30 MED ORDER — SODIUM CHLORIDE 0.9 % IV SOLN
30.0000 meq | Freq: Once | INTRAVENOUS | Status: AC
  Administered 2016-06-30: 30 meq via INTRAVENOUS
  Filled 2016-06-30: qty 15

## 2016-06-30 MED ORDER — ENOXAPARIN SODIUM 40 MG/0.4ML ~~LOC~~ SOLN
40.0000 mg | SUBCUTANEOUS | Status: DC
  Administered 2016-06-30: 40 mg via SUBCUTANEOUS
  Filled 2016-06-30 (×2): qty 0.4

## 2016-06-30 MED ORDER — BUSPIRONE HCL 10 MG PO TABS
10.0000 mg | ORAL_TABLET | Freq: Three times a day (TID) | ORAL | Status: DC
  Administered 2016-06-30 – 2016-07-02 (×6): 10 mg via ORAL
  Filled 2016-06-30 (×8): qty 1

## 2016-06-30 MED ORDER — LORAZEPAM 2 MG/ML IJ SOLN
1.0000 mg | INTRAMUSCULAR | Status: DC | PRN
Start: 2016-06-30 — End: 2016-07-02

## 2016-06-30 NOTE — Progress Notes (Signed)
New Admission Note:   Arrival Method: per stretcher from ED, pt came from [facility] Mental Orientation: alert, disoriented X4, combative Telemetry: none ordered Assessment: Completed Skin: warm, dry, with abrasions and scattered bruises on both arms, no preexisting open wound/sore noted, redness on the sacrum noted, prophylactic sacral foam dressing applied IV: G20 on the right wrist wrapped with gauze, flushed and saline locked Pain: pt appears to be comfortable, PAINAD score=0 Safety Measures: Safety Fall Prevention Plan has been given and discussed with daughter. Safety sitter Trey Paula, NT at bedside Admission: Completed 1A Orientation: Pt is confused  Family: daughter at bedside  Orders have been reviewed and implemented. Will continue to monitor patient. Call light has been placed within reach and bed alarm has been activated.   Janice Norrie BSN, RN [facility]

## 2016-06-30 NOTE — Progress Notes (Signed)
[facility] - [place] at [facility]   PATIENT NAME: Cynthia Glenn    MR#:  [id number]  DATE OF BIRTH:  1920-09-15  SUBJECTIVE:  CHIEF COMPLAINT:   Chief Complaint  Patient presents with  . Altered Mental Status   - Admitted with altered mental Status, worsening agitation. Currently resting. Refused blood draw this morning. -Sitter at bedside. Family also at bedside.  REVIEW OF SYSTEMS:  Review of Systems  Unable to perform ROS: Dementia    DRUG ALLERGIES:   Allergies  Allergen Reactions  . Lisinopril Cough  . Statins Hives  . Ciprofloxacin     VITALS:  Blood pressure (!) 154/51, pulse 64, temperature 97.6 F (36.4 C), temperature source Oral, resp. rate 19, height  (1.651 m), weight 72.1 kg (159 lb), SpO2 97 %.  PHYSICAL EXAMINATION:  Physical Exam  GENERAL:  83 y.o.-year-old Elderly patient lying in the bed with no acute distress.  EYES: Pupils equal, round, reactive to light and accommodation. No scleral icterus. Extraocular muscles intact.  HEENT: Head atraumatic, normocephalic. Oropharynx and nasopharynx clear.  NECK:  Supple, no jugular venous distention. No thyroid enlargement, no tenderness.  LUNGS: Normal breath sounds bilaterally, no wheezing, rales,rhonchi or crepitation. No use of accessory muscles of respiration.  CARDIOVASCULAR: S1, S2 normal. No rubs, or gallops. 3/6 systolic murmur present ABDOMEN: Soft, nontender, nondistended. Bowel sounds present. No organomegaly or mass.  EXTREMITIES: No pedal edema, cyanosis, or clubbing.  NEUROLOGIC: sleeping, able to move all four extremities, not following commands.  PSYCHIATRIC: The patient is sleeping, but disoriented when awakened.  SKIN: No obvious rash, lesion, or ulcer.    LABORATORY PANEL:   CBC  Recent Labs Lab 06/29/16 1532  WBC 7.1  HGB 15.3  HCT 44.7  PLT 203    ------------------------------------------------------------------------------------------------------------------  Chemistries   Recent Labs Lab 06/29/16 1532  NA 143  K 2.9*  CL 106  CO2 26  GLUCOSE 108*  BUN 13  CREATININE 0.93  CALCIUM 9.4  AST 23  ALT 6*  ALKPHOS 46  BILITOT 0.9   ------------------------------------------------------------------------------------------------------------------  Cardiac Enzymes No results for input(s): TROPONINI in the last 168 hours. ------------------------------------------------------------------------------------------------------------------  RADIOLOGY:  Ct Head Wo Contrast  Result Date: 06/29/2016 CLINICAL DATA:  Altered mental status and agitation. Urinary tract infection. EXAM: CT HEAD WITHOUT CONTRAST TECHNIQUE: Contiguous axial images were obtained from the base of the skull through the vertex without intravenous contrast. COMPARISON:  06/20/2016 FINDINGS: Brain: No acute finding. Generalized atrophy. Chronic small-vessel ischemic changes of the hemispheric white matter, basal ganglia and thalami I. no mass lesion, hemorrhage, hydrocephalus or extra-axial collection. Vascular: There is atherosclerotic calcification of the major vessels at the base of the brain. Skull: Normal Sinuses/Orbits: Clear/ normal. Other: None significant IMPRESSION: No acute finding by CT. Atrophy and chronic small-vessel ischemic changes. No change since study of 9 days ago. Electronically Signed   By: Paulina Fusi M.D.   On: 06/29/2016 16:41   Dg Chest Port 1 View  Result Date: 06/29/2016 CLINICAL DATA:  Sepsis? History of dementia. History of COPD and hypertension. EXAM: PORTABLE CHEST 1 VIEW COMPARISON:  Chest x-rays dated 06/28/2015 and 12/17/2014. FINDINGS: Cardiomegaly is stable. Atherosclerosis of the ectatic thoracic aorta. Lungs are clear. No pleural effusion or pneumothorax seen. Osseous structures about the chest are unremarkable. IMPRESSION: 1.  No active disease.  No evidence of pneumonia or pulmonary edema. 2. Stable cardiomegaly. 3. Aortic atherosclerosis. Electronically Signed   By: Bary Richard M.D.   On:  06/29/2016 16:36    EKG:   Orders placed or performed during the hospital encounter of 06/29/16  . ED EKG 12-Lead  . ED EKG 12-Lead  . EKG 12-Lead  . EKG 12-Lead    ASSESSMENT AND PLAN:   83 year old female with multiple medical problems including severe Alzheimer's dementia, COPD, CVA, hypertension, peripheral vascular disease, CK D brought in from the [place] assisted living facility secondary to worsening agitation.  #1 acute cystitis-urine cultures are pending. Continue Rocephin while inpatient.  #2 delirium-delirium from infection but also behavioral disturbances secondary to worsening dementia. CT of the head without any acute findings. -Her by mouth intake is still average at this time. The patient has refused other basic needs including cleaning, changing clothes etc. -Has a sitter at bedside currently. If able to take her medications, we'll continue her Aricept and Namenda -Also will continue the BuSpar 3 times a day  #3 hypokalemia-from poor by mouth intake. Replaced yesterday, however spitting her medications by mouth. We'll give another dose IV today. Refused blood draw this morning  #4 hypertension-continue atenolol and Norvasc at this time. Monitor blood pressure  #5 DVT prophylaxis-on subcutaneous Lovenox   Social worker consult for disposition planning as might not be able to return to assisted living    All the records are reviewed and case discussed with Care Management/Social Workerr. Management plans discussed with the patient, family and they are in agreement.  CODE STATUS: DNR  TOTAL TIME TAKING CARE OF THIS PATIENT: 33 minutes.   POSSIBLE D/C IN 2 DAYS, DEPENDING ON CLINICAL CONDITION.   Enid Baas M.D on 06/30/2016 at 10:22 AM  Between 7am to 6pm - Pager - [phone]  After  6pm go to [email or web address] - password [email or web address] [email or web address]  [facility]  Office  [phone]  CC: Primary care physician; PROVIDER NOT IN SYSTEM

## 2016-06-30 NOTE — [place] FL2 (Signed)
[place] MEDICAID FL2 LEVEL OF CARE SCREENING TOOL     IDENTIFICATION  Patient Name: Cynthia Glenn Birthdate: 07-04-1920 Sex: female Admission Date (Current Location): 06/29/2016  [place] and [place] Number:  Chiropodist and Address:  [facility], [address] [place], [place] [address]      Provider Number: [id number]  Attending Physician Name and Address:  Enid Baas, MD  Relative Name and Phone Number:       Current Level of Care: Hospital Recommended Level of Care: Assisted Living Facility Prior Approval Number:    Date Approved/Denied:   PASRR Number: [id number]  Discharge Plan: Domiciliary (Rest home)    Current Diagnoses: Patient Active Problem List   Diagnosis Date Noted  . UTI (urinary tract infection) 06/29/2016  . Bradycardia 06/28/2015  . Cellulitis and abscess of neck 01/03/2015  . Submandibular abscess 01/03/2015    Orientation RESPIRATION BLADDER Height & Weight     Self  Normal Incontinent Weight: 159 lb (72.1 kg) Height:   (165.1 cm)  BEHAVIORAL SYMPTOMS/MOOD NEUROLOGICAL BOWEL NUTRITION STATUS  Physically abusive, Dangerous to self, others or property   Continent    AMBULATORY STATUS COMMUNICATION OF NEEDS Skin   Extensive Assist Verbally Normal                       Personal Care Assistance Level of Assistance  Bathing, Feeding, Dressing Bathing Assistance: Maximum assistance Feeding assistance: Limited assistance Dressing Assistance: Maximum assistance     Functional Limitations Info             SPECIAL CARE FACTORS FREQUENCY                       Contractures Contractures Info: Present    Additional Factors Info  Code Status, Allergies Code Status Info: DNR Allergies Info: Lisinopril, Statins, Ciprofloxacin           Current Medications (06/30/2016):  This is the current hospital active medication list Current Facility-Administered Medications  Medication  Dose Route Frequency Provider Last Rate Last Dose  . acetaminophen (TYLENOL) tablet 500 mg  500 mg Oral BID Katha Hamming, MD   500 mg at 06/30/16 1139  . acidophilus (RISAQUAD) capsule 2 capsule  2 capsule Oral TID Katha Hamming, MD   2 capsule at 06/29/16 2201  . amLODipine (NORVASC) tablet 10 mg  10 mg Oral Daily Katha Hamming, MD      . aspirin EC tablet 81 mg  81 mg Oral Daily Rolm Baptise, RPH   81 mg at 06/30/16 1139  . atenolol (TENORMIN) tablet 50 mg  50 mg Oral Daily Katha Hamming, MD      . bisacodyl (DULCOLAX) EC tablet 5 mg  5 mg Oral Daily PRN Katha Hamming, MD      . busPIRone (BUSPAR) tablet 10 mg  10 mg Oral TID Enid Baas, MD   10 mg at 06/30/16 1140  . cefTRIAXone (ROCEPHIN) 1 g in dextrose 5 % 50 mL IVPB  1 g Intravenous Q24H Rolm Baptise, RPH      . cyanocobalamin tablet 500 mcg  500 mcg Oral Daily Katha Hamming, MD   500 mcg at 06/29/16 2203  . docusate sodium (COLACE) capsule 100 mg  100 mg Oral BID Katha Hamming, MD   100 mg at 06/29/16 2202  . donepezil (ARICEPT) tablet 10 mg  10 mg Oral QHS Katha Hamming, MD   10 mg at  06/29/16 2201  . enoxaparin (LOVENOX) injection 40 mg  40 mg Subcutaneous Q24H Enid Baas, MD      . fluticasone (FLONASE) 50 MCG/ACT nasal spray 2 spray  2 spray Each Nare Daily Katha Hamming, MD      . LORazepam (ATIVAN) injection 1 mg  1 mg Intravenous Q4H PRN Enid Baas, MD      . memantine [facility]) tablet 10 mg  10 mg Oral BID Katha Hamming, MD   10 mg at 06/29/16 2201  . ondansetron (ZOFRAN) tablet 4 mg  4 mg Oral Q6H PRN Katha Hamming, MD       Or  . ondansetron (ZOFRAN) injection 4 mg  4 mg Intravenous Q6H PRN Katha Hamming, MD      . potassium chloride 30 mEq in sodium chloride 0.9 % 265 mL (KCL MULTIRUN) IVPB  30 mEq Intravenous Once Enid Baas, MD   30 mEq at 06/30/16 1244  . traZODone (DESYREL) tablet 25 mg  25 mg Oral QHS PRN Katha Hamming, MD   25 mg at 06/29/16 2202     Discharge Medications: Please see discharge summary for a list of discharge medications.  Relevant Imaging Results:  Relevant Lab Results:   Additional Information SS# [id number]  Judi Cong, LCSW

## 2016-06-30 NOTE — Care Management Obs Status (Signed)
MEDICARE OBSERVATION STATUS NOTIFICATION   Patient Details  Name: Cynthia Glenn MRN: [id number] Date of Birth: [birth year]   Medicare Observation Status Notification Given:  Yes (MOON letter given)    Jolee Ewing, RN 06/30/2016, 5:24 PM

## 2016-06-30 NOTE — Clinical Social Work Note (Signed)
Clinical Social Work Assessment  Patient Details  Name: Cynthia Glenn MRN: [id number] Date of Birth: 11-Jul-1920  Date of referral:  06/30/16               Reason for consult:  Facility Placement                Permission sought to share information with:  Facility Art therapist granted to share information::  Yes, Verbal Permission Granted  Name::        Agency::     Relationship::     Contact Information:     Housing/Transportation Living arrangements for the past 2 months:  [place] of Information:  Adult Children Patient Interpreter Needed:  None Criminal Activity/Legal Involvement Pertinent to Current Situation/Hospitalization:  No - Comment as needed Significant Relationships:  Adult Children Lives with:  Facility Resident Do you feel safe going back to the place where you live?  No (Patient has become increasingly combative at her facility causing safety issues for the patient and staff.) Need for family participation in patient care:  Yes (Comment)  Care giving concerns:  Possible change in facility due to MCU needs.   Social Worker assessment / plan:  CSW met with the patient's son and daughter-in-law to discuss their concerns about her current level of care. The patient is a resident at the [facility], and she is able to return. However, in the past 2 weeks, she has been increasingly combative and refusing bathing and medication. The patient has Medicaid in addition to Medicare. She is baseline confused and ambulates independently.  The CSW explained the difference between ALF without MCU and MCU levels of care. The family is interested in pursuing this level of care, and they are willing to have the patient return to the [place] in the meantime if a placement is not secured by the time she is medically stable. The family is going to begin discussions about the change in care, and they have the list of options to begin visiting  locations.   The plan for now is to continue a medical workup and have palliative care assess the patient for possible home hospice needs at dc to whichever facility the patient's family chooses.   Employment status:  Retired Forensic scientist:  Information systems manager, Medicaid In [place] PT Recommendations:  Not assessed at this time [place] / Referral to community resources:  Other (Comment Required) (MCUs in the area)  Patient/Family's Response to care:  The patient's family thanked CSW for assistance.  Patient/Family's Understanding of and Emotional Response to Diagnosis, Current Treatment, and Prognosis:  The patient's family is aware of the patient's advancing dementia needs. The patient is not able to understand the issues.  Emotional Assessment Appearance:  Appears stated age Attitude/Demeanor/Rapport:  Aggressive (Verbally and/or physically) Affect (typically observed):  Defensive Orientation:  Oriented to Self Alcohol / Substance use:  Never Used Psych involvement (Current and /or in the community):  No (Comment)  Discharge Needs  Concerns to be addressed:  Discharge Planning Concerns, Care Coordination, Home Safety Concerns Readmission within the last 30 days:  No Current discharge risk:  Cognitively Impaired Barriers to Discharge:  Continued Medical Work up   [employer], LCSW 06/30/2016, 1:02 PM

## 2016-06-30 NOTE — Progress Notes (Signed)
Pt's family has spoken to Child psychotherapist. Pt combative briefly, c/o back pain and received tylenol with as many of her am meds as pt would tolerate. 1:1 sitter continues. This writer attempted to reorient pt.

## 2016-06-30 NOTE — Progress Notes (Signed)
Shift assessment completed at 0800. Sitter at bedside, pt attempting to get oob to go to bathroom, pt alert to self only, pt partially incontinent and used bedpan to finish voiding. Pt returned to sleep af ter this. Pt is on room air, lungs clear bilat, HR is regular, abdomen is soft, bs heard. piv #22 intact to r hand with kling wrapped on the site ot avoid pt disloding this. Ppp, non pitting edema noted to bilat lower legs, pt has various bruises on ehr arms. Pt's son and dil arrived after assessment, stated that pthas been in assissted living at [facility] for years, and has begun in recent past to be combative with staff and refused all interventions..showering, help with ambulation and toileting, etc. Son and dil looking for guidance as to appropriate placement for pt on discharge, they state that [facility] is willing to take pt back but they feel this will be futile as pt will continue to refuse interventions there, would be willing to accept a hospice referral for placement elsewhere. Family has had an opportunity to speak with Dr. Nemiah Commander about this, and are awaiting social worker. Family is aware that this may be an advancement of pt's dementia as well.

## 2016-07-01 DIAGNOSIS — G309 Alzheimer's disease, unspecified: Secondary | ICD-10-CM | POA: Diagnosis present

## 2016-07-01 DIAGNOSIS — J449 Chronic obstructive pulmonary disease, unspecified: Secondary | ICD-10-CM | POA: Diagnosis present

## 2016-07-01 DIAGNOSIS — Z79899 Other long term (current) drug therapy: Secondary | ICD-10-CM | POA: Diagnosis not present

## 2016-07-01 DIAGNOSIS — Z7951 Long term (current) use of inhaled steroids: Secondary | ICD-10-CM | POA: Diagnosis not present

## 2016-07-01 DIAGNOSIS — K219 Gastro-esophageal reflux disease without esophagitis: Secondary | ICD-10-CM | POA: Diagnosis present

## 2016-07-01 DIAGNOSIS — I10 Essential (primary) hypertension: Secondary | ICD-10-CM | POA: Diagnosis present

## 2016-07-01 DIAGNOSIS — I739 Peripheral vascular disease, unspecified: Secondary | ICD-10-CM | POA: Diagnosis present

## 2016-07-01 DIAGNOSIS — E538 Deficiency of other specified B group vitamins: Secondary | ICD-10-CM | POA: Diagnosis present

## 2016-07-01 DIAGNOSIS — Z7982 Long term (current) use of aspirin: Secondary | ICD-10-CM | POA: Diagnosis not present

## 2016-07-01 DIAGNOSIS — N3000 Acute cystitis without hematuria: Secondary | ICD-10-CM | POA: Diagnosis present

## 2016-07-01 DIAGNOSIS — Z79891 Long term (current) use of opiate analgesic: Secondary | ICD-10-CM | POA: Diagnosis not present

## 2016-07-01 DIAGNOSIS — F05 Delirium due to known physiological condition: Secondary | ICD-10-CM | POA: Diagnosis present

## 2016-07-01 DIAGNOSIS — R4182 Altered mental status, unspecified: Secondary | ICD-10-CM | POA: Diagnosis present

## 2016-07-01 DIAGNOSIS — E876 Hypokalemia: Secondary | ICD-10-CM | POA: Diagnosis present

## 2016-07-01 DIAGNOSIS — F0281 Dementia in other diseases classified elsewhere with behavioral disturbance: Secondary | ICD-10-CM | POA: Diagnosis present

## 2016-07-01 DIAGNOSIS — Z8673 Personal history of transient ischemic attack (TIA), and cerebral infarction without residual deficits: Secondary | ICD-10-CM | POA: Diagnosis not present

## 2016-07-01 DIAGNOSIS — E785 Hyperlipidemia, unspecified: Secondary | ICD-10-CM | POA: Diagnosis present

## 2016-07-01 DIAGNOSIS — Z66 Do not resuscitate: Secondary | ICD-10-CM | POA: Diagnosis present

## 2016-07-01 LAB — URINE CULTURE

## 2016-07-01 LAB — GLUCOSE, CAPILLARY: Glucose-Capillary: 107 mg/dL — ABNORMAL HIGH (ref 65–99)

## 2016-07-01 MED ORDER — RISAQUAD PO CAPS
1.0000 | ORAL_CAPSULE | Freq: Every day | ORAL | Status: DC
  Administered 2016-07-02: 1 via ORAL
  Filled 2016-07-01: qty 1

## 2016-07-01 NOTE — Progress Notes (Signed)
[facility] - [place] at [facility]   PATIENT NAME: Cynthia Glenn    MR#:  [id number]  DATE OF BIRTH:  05/16/1920  SUBJECTIVE:  CHIEF COMPLAINT:   Chief Complaint  Patient presents with  . Altered Mental Status   - Fighting off blood draw again. Asking to be left alone. -More alert and answering simple questions. Family at bedside.  REVIEW OF SYSTEMS:  Review of Systems  Unable to perform ROS: Dementia    DRUG ALLERGIES:   Allergies  Allergen Reactions  . Lisinopril Cough  . Statins Hives  . Ciprofloxacin     VITALS:  Blood pressure (!) 159/57, pulse 83, temperature 98.8 F (37.1 C), resp. rate 18, height  (1.651 m), weight 74.9 kg (165 lb 1.6 oz), SpO2 97 %.  PHYSICAL EXAMINATION:  Physical Exam  GENERAL:  83 y.o.-year-old Elderly patient lying in the bed with no acute distress.  EYES: Pupils equal, round, reactive to light and accommodation. No scleral icterus. Extraocular muscles intact.  HEENT: Head atraumatic, normocephalic. Oropharynx and nasopharynx clear.  NECK:  Supple, no jugular venous distention. No thyroid enlargement, no tenderness.  LUNGS: Normal breath sounds bilaterally, no wheezing, rales,rhonchi or crepitation. No use of accessory muscles of respiration.  CARDIOVASCULAR: S1, S2 normal. No rubs, or gallops. 3/6 systolic murmur present ABDOMEN: Soft, nontender, nondistended. Bowel sounds present. No organomegaly or mass.  EXTREMITIES: No pedal edema, cyanosis, or clubbing.  NEUROLOGIC: Alert, able to move all four extremities, not following commands.  PSYCHIATRIC: The patient is Alert but oriented to self.  SKIN: No obvious rash, lesion, or ulcer.    LABORATORY PANEL:   CBC  Recent Labs Lab 06/29/16 1532  WBC 7.1  HGB 15.3  HCT 44.7  PLT 203   ------------------------------------------------------------------------------------------------------------------  Chemistries   Recent Labs Lab 06/29/16 1532  NA  143  K 2.9*  CL 106  CO2 26  GLUCOSE 108*  BUN 13  CREATININE 0.93  CALCIUM 9.4  AST 23  ALT 6*  ALKPHOS 46  BILITOT 0.9   ------------------------------------------------------------------------------------------------------------------  Cardiac Enzymes No results for input(s): TROPONINI in the last 168 hours. ------------------------------------------------------------------------------------------------------------------  RADIOLOGY:  Ct Head Wo Contrast  Result Date: 06/29/2016 CLINICAL DATA:  Altered mental status and agitation. Urinary tract infection. EXAM: CT HEAD WITHOUT CONTRAST TECHNIQUE: Contiguous axial images were obtained from the base of the skull through the vertex without intravenous contrast. COMPARISON:  06/20/2016 FINDINGS: Brain: No acute finding. Generalized atrophy. Chronic small-vessel ischemic changes of the hemispheric white matter, basal ganglia and thalami I. no mass lesion, hemorrhage, hydrocephalus or extra-axial collection. Vascular: There is atherosclerotic calcification of the major vessels at the base of the brain. Skull: Normal Sinuses/Orbits: Clear/ normal. Other: None significant IMPRESSION: No acute finding by CT. Atrophy and chronic small-vessel ischemic changes. No change since study of 9 days ago. Electronically Signed   By: Paulina Fusi M.D.   On: 06/29/2016 16:41   Dg Chest Port 1 View  Result Date: 06/29/2016 CLINICAL DATA:  Sepsis? History of dementia. History of COPD and hypertension. EXAM: PORTABLE CHEST 1 VIEW COMPARISON:  Chest x-rays dated 06/28/2015 and 12/17/2014. FINDINGS: Cardiomegaly is stable. Atherosclerosis of the ectatic thoracic aorta. Lungs are clear. No pleural effusion or pneumothorax seen. Osseous structures about the chest are unremarkable. IMPRESSION: 1. No active disease.  No evidence of pneumonia or pulmonary edema. 2. Stable cardiomegaly. 3. Aortic atherosclerosis. Electronically Signed   By: Bary Richard M.D.   On:  06/29/2016 16:36  EKG:   Orders placed or performed during the hospital encounter of 06/29/16  . ED EKG 12-Lead  . ED EKG 12-Lead  . EKG 12-Lead  . EKG 12-Lead    ASSESSMENT AND PLAN:   83 year old female with multiple medical problems including severe Alzheimer's dementia, COPD, CVA, hypertension, peripheral vascular disease, CK D brought in from the [place] [facility] assisted living facility secondary to worsening agitation.  #1 acute cystitis-urine cultures are Growing multiple species. Continue Rocephin while inpatient.  #2 delirium-delirium from infection but also behavioral disturbances secondary to worsening dementia. CT of the head without any acute findings. -Her by mouth intake is still average at this time. The patient has refused other basic needs including cleaning, changing clothes etc. -Has a sitter at bedside currently. If able to take her medications, we'll continue her Aricept and Namenda -Also will continue the BuSpar 3 times a day  #3 hypokalemia-from poor by mouth intake. Replaced by IV and PO> - will not re-order labs at this time as patient is refusing and we might be going hospice route.  #4 hypertension-continue atenolol and Norvasc at this time. Monitor blood pressure  #5 DVT prophylaxis-on subcutaneous Lovenox   Social worker consult for discharge planning Will need hospice at discharge    All the records are reviewed and case discussed with Care Management/Social Workerr. Management plans discussed with the patient, family and they are in agreement.  CODE STATUS: DNR  TOTAL TIME TAKING CARE OF THIS PATIENT: 33 minutes.   POSSIBLE D/C IN 1 DAY, DEPENDING ON CLINICAL CONDITION.   Enid Baas M.D on 07/01/2016 at 12:47 PM  Between 7am to 6pm - Pager - [phone]  After 6pm go to [email or web address] - password [email or web address]  [facility] [place]  Office  [phone]  CC: Primary care physician; PROVIDER NOT IN SYSTEM

## 2016-07-02 LAB — GLUCOSE, CAPILLARY: Glucose-Capillary: 102 mg/dL — ABNORMAL HIGH (ref 65–99)

## 2016-07-02 MED ORDER — LORAZEPAM 1 MG PO TABS: 1.0000 mg | ORAL_TABLET | Freq: Three times a day (TID) | ORAL | 0 refills | Status: AC

## 2016-07-02 MED ORDER — TRAZODONE HCL 50 MG PO TABS: 25.0000 mg | ORAL_TABLET | Freq: Every evening | ORAL | 0 refills | Status: AC | PRN

## 2016-07-02 MED ORDER — MORPHINE SULFATE (CONCENTRATE) 20 MG/ML PO SOLN: 10.0000 mg | ORAL | 0 refills | Status: AC | PRN

## 2016-07-02 MED ORDER — BISACODYL 10 MG RE SUPP
10.0000 mg | Freq: Once | RECTAL | Status: AC
  Administered 2016-07-02: 10 mg via RECTAL
  Filled 2016-07-02: qty 1

## 2016-07-02 MED ORDER — CEFDINIR 300 MG PO CAPS: 300.0000 mg | ORAL_CAPSULE | Freq: Two times a day (BID) | ORAL | 0 refills | Status: DC

## 2016-07-02 NOTE — NC FL2 (Signed)
[place] MEDICAID FL2 LEVEL OF CARE SCREENING TOOL     IDENTIFICATION  Patient Name: Cynthia Glenn Birthdate: 1921/02/02 Sex: female Admission Date (Current Location): 06/29/2016  [place] and [place] Number:  Chiropodist and Address:  [facility], [address], [place], [place] [address]      Provider Number: [id number]  Attending Physician Name and Address:  Enid Baas, MD  Relative Name and Phone Number:       Current Level of Care: Hospital Recommended Level of Care: Assisted Living Facility Prior Approval Number:    Date Approved/Denied:   PASRR Number: [id number]  Discharge Plan: Domiciliary (Rest home)    Current Diagnoses: Patient Active Problem List   Diagnosis Date Noted  . UTI (urinary tract infection) 06/29/2016  . Bradycardia 06/28/2015  . Cellulitis and abscess of neck 01/03/2015  . Submandibular abscess 01/03/2015    Orientation RESPIRATION BLADDER Height & Weight     Self  Normal Incontinent Weight: 165 lb 3.2 oz (74.9 kg) Height:   (165.1 cm)  BEHAVIORAL SYMPTOMS/MOOD NEUROLOGICAL BOWEL NUTRITION STATUS  Physically abusive, Dangerous to self, others or property   Continent  Regular Diet   AMBULATORY STATUS COMMUNICATION OF NEEDS Skin   Extensive Assist Verbally Normal                       Personal Care Assistance Level of Assistance  Bathing, Feeding, Dressing Bathing Assistance: Maximum assistance Feeding assistance: Limited assistance Dressing Assistance: Maximum assistance     Functional Limitations Info             SPECIAL CARE FACTORS FREQUENCY   Patient will have [facility] services following at facility                     Contractures Contractures Info: Present    Additional Factors Info  Code Status, Allergies Code Status Info: DNR Allergies Info: Lisinopril, Statins, Ciprofloxacin          Discharge Medications: Please see discharge  summary for a list of discharge medications. Medication List    STOP taking these medications   CRANBERRY CONCENTRATE 500 MG Caps Generic drug:  Cranberry   guaifenesin 100 MG/5ML syrup Commonly known as:  ROBITUSSIN   HYDROcodone-acetaminophen 5-325 MG tablet Commonly known as:  NORCO/VICODIN   nitrofurantoin (macrocrystal-monohydrate) 100 MG capsule Commonly known as:  MACROBID   pravastatin 20 MG tablet Commonly known as:  PRAVACHOL   valsartan 320 MG tablet Commonly known as:  DIOVAN     TAKE these medications   acetaminophen 500 MG tablet Commonly known as:  TYLENOL Take 500 mg by mouth 2 (two) times daily.   albuterol (2.5 MG/3ML) 0.083% nebulizer solution Commonly known as:  PROVENTIL Take 2.5 mg by nebulization every 6 (six) hours as needed for wheezing or shortness of breath.   amLODipine 10 MG tablet Commonly known as:  NORVASC Take 10 mg by mouth daily.   aspirin 81 MG tablet Take 81 mg by mouth daily.   atenolol 50 MG tablet Commonly known as:  TENORMIN Take 50 mg by mouth daily.   busPIRone 15 MG tablet Commonly known as:  BUSPAR Take 15 mg by mouth 3 (three) times daily.   calcium-vitamin D 500-200 MG-UNIT tablet Commonly known as:  OSCAL WITH D Take 1 tablet by mouth.   cefdinir 300 MG capsule Commonly known as:  OMNICEF Take 1 capsule (300 mg total) by mouth 2 (  two) times daily. X 5 more days   Cholecalciferol 2000 units Tabs Take 2,000 Units by mouth daily.   docusate sodium 100 MG capsule Commonly known as:  COLACE Take 100 mg by mouth 2 (two) times daily as needed for mild constipation.   donepezil 10 MG tablet Commonly known as:  ARICEPT Take 10 mg by mouth at bedtime.   fluticasone 50 MCG/ACT nasal spray Commonly known as:  FLONASE Place 2 sprays into both nostrils daily.   lactobacillus acidophilus Tabs tablet Take 2 tablets by mouth 3 (three) times daily.   LORazepam 1 MG tablet Commonly known as:   ATIVAN Take 1 tablet (1 mg total) by mouth every 8 (eight) hours.   memantine 10 MG tablet Commonly known as:  NAMENDA Take 10 mg by mouth 2 (two) times daily.   MINERIN Crea Apply 1 application topically 2 (two) times daily.   morphine 20 MG/ML concentrated solution Commonly known as:  ROXANOL Take 0.5 mLs (10 mg total) by mouth every 4 (four) hours as needed for severe pain.   ondansetron 4 MG tablet Commonly known as:  ZOFRAN Take 4 mg by mouth every 8 (eight) hours as needed for nausea or vomiting.   traZODone 50 MG tablet Commonly known as:  DESYREL Take 0.5 tablets (25 mg total) by mouth at bedtime as needed for sleep.   vitamin B-12 500 MCG tablet Commonly known as:  CYANOCOBALAMIN Take 500 mcg by mouth daily.  Relevant Imaging Results: Relevant Lab Results: Additional Information SS# [id number]  [place], Darleen Crocker, [place]

## 2016-07-02 NOTE — Progress Notes (Signed)
New referral for [facility] services at [facility] received from [facility]. Mrs. Cynthia Glenn is a 83 year old woman with a known history of COPD, Alzheimer's dementia, PVD, HTN, HLD, arthritis and GERD admitted to [facility] on 4/6 for evaluation and treatment of acute delirium, hypokalemia and UTI. She has received IV antibiotics and potassium replacement, she continues with poor oral intake and has required a telesitter at bedside for confusion as well as refusing personal care. Attending physician met with patient's son Richardson Landry and daughter [name] and they have chosen to have patient return to [facility] with hospice care. Writer met in the room with Richardson Landry and [place]. Patient seen lying in bed, greeted writer with a quiet hello and them closed her eyes. Richardson Landry and [name] report the changes/decline they have seen in their mother over the past few weeks. Her increased "combativeness and confusion". She was ambulatory prior to coming to [facility]. Per Richardson Landry she has been refusing medications, personal care or any attempt to get her out of bed. Writer initiated education regarding hospice services, philosophy and team approach to care with good understanding voiced. Plan is for patient to discharge today via EMS. Signed DNR in place in patient's chart. Hospice information and contact number given to [facility]. Patient information faxed to referral. Of note patient's PCP: is [facility]. Hospital care team updated. Thank you. Flo Shanks RN, BSN, [facility] [place], hospital Liaison [phone] c

## 2016-07-02 NOTE — Discharge Summary (Signed)
[facility] -  at [facility]   PATIENT NAME: Cynthia Glenn    MR#:  [id number]  DATE OF BIRTH:  July 12, 1920  DATE OF ADMISSION:  06/29/2016   ADMITTING PHYSICIAN: Katha Hamming, MD  DATE OF DISCHARGE: 07/02/2016  PRIMARY CARE PHYSICIAN: PROVIDER NOT IN SYSTEM   ADMISSION DIAGNOSIS:   Hypokalemia [E87.6] Lower urinary tract infectious disease [N39.0] Delirium [R41.0]  DISCHARGE DIAGNOSIS:   Active Problems:   UTI (urinary tract infection)   SECONDARY DIAGNOSIS:   Past Medical History:  Diagnosis Date  . Arthritis   . COPD (chronic obstructive pulmonary disease) (HCC)   . CVA (cerebral infarction)   . Dementia   . Dementia   . Essential tremor   . GERD (gastroesophageal reflux disease)   . HTN (hypertension)   . Hyperlipemia   . Hypertension   . Polycystic kidney disease   . PVD (peripheral vascular disease) (HCC)   . Seasonal allergies   . Shingles   . Vitamin B12 deficiency   . Vitamin D deficiency     HOSPITAL COURSE:   83 year old female with multiple medical problems including severe Alzheimer's dementia, COPD, CVA, hypertension, peripheral vascular disease, CK D brought in from the [facility] facility secondary to worsening agitation.  #1 acute cystitis-urine cultures are Growing multiple species. Received Rocephin in the hospital. Will be discharged on cefdinir  #2 delirium-delirium from infection but also behavioral disturbances secondary to worsening dementia. CT of the head without any acute findings. -Her by mouth intake is still average at this time. The patient has refused other basic needs including cleaning, changing clothes etc. -continue her Aricept and Namenda -Also will continue the BuSpar 3 times a day -Ativan added as needed for anxiety/agitation  #3 hypokalemia-from poor by mouth intake. Replaced by IV and PO> - will not re-order labs at this time as patient is refusing and will be followed  by hospice.  #4 hypertension-continue atenolol and Norvasc at this time. Monitor blood pressure   Patient will be discharged back to the assisted living with hospice following  DISCHARGE CONDITIONS:   Guarded CONSULTS OBTAINED:    none  DRUG ALLERGIES:   Allergies  Allergen Reactions  . Lisinopril Cough  . Statins Hives  . Ciprofloxacin    DISCHARGE MEDICATIONS:   Allergies as of 07/02/2016      Reactions   Lisinopril Cough   Statins Hives   Ciprofloxacin       Medication List    STOP taking these medications   CRANBERRY CONCENTRATE 500 MG Caps Generic drug:  Cranberry   guaifenesin 100 MG/5ML syrup Commonly known as:  ROBITUSSIN   HYDROcodone-acetaminophen 5-325 MG tablet Commonly known as:  NORCO/VICODIN   nitrofurantoin (macrocrystal-monohydrate) 100 MG capsule Commonly known as:  MACROBID   pravastatin 20 MG tablet Commonly known as:  PRAVACHOL   valsartan 320 MG tablet Commonly known as:  DIOVAN     TAKE these medications   acetaminophen 500 MG tablet Commonly known as:  TYLENOL Take 500 mg by mouth 2 (two) times daily.   albuterol (2.5 MG/3ML) 0.083% nebulizer solution Commonly known as:  PROVENTIL Take 2.5 mg by nebulization every 6 (six) hours as needed for wheezing or shortness of breath.   amLODipine 10 MG tablet Commonly known as:  NORVASC Take 10 mg by mouth daily.   aspirin 81 MG tablet Take 81 mg by mouth daily.   atenolol 50 MG tablet Commonly known as:  TENORMIN Take 50 mg by  mouth daily.   busPIRone 15 MG tablet Commonly known as:  BUSPAR Take 15 mg by mouth 3 (three) times daily.   calcium-vitamin D 500-200 MG-UNIT tablet Commonly known as:  OSCAL WITH D Take 1 tablet by mouth.   cefdinir 300 MG capsule Commonly known as:  OMNICEF Take 1 capsule (300 mg total) by mouth 2 (two) times daily. X 5 more days   Cholecalciferol 2000 units Tabs Take 2,000 Units by mouth daily.   docusate sodium 100 MG capsule Commonly  known as:  COLACE Take 100 mg by mouth 2 (two) times daily as needed for mild constipation.   donepezil 10 MG tablet Commonly known as:  ARICEPT Take 10 mg by mouth at bedtime.   fluticasone 50 MCG/ACT nasal spray Commonly known as:  FLONASE Place 2 sprays into both nostrils daily.   lactobacillus acidophilus Tabs tablet Take 2 tablets by mouth 3 (three) times daily.   LORazepam 1 MG tablet Commonly known as:  ATIVAN Take 1 tablet (1 mg total) by mouth every 8 (eight) hours.   memantine 10 MG tablet Commonly known as:  NAMENDA Take 10 mg by mouth 2 (two) times daily.   MINERIN Crea Apply 1 application topically 2 (two) times daily.   morphine 20 MG/ML concentrated solution Commonly known as:  ROXANOL Take 0.5 mLs (10 mg total) by mouth every 4 (four) hours as needed for severe pain.   ondansetron 4 MG tablet Commonly known as:  ZOFRAN Take 4 mg by mouth every 8 (eight) hours as needed for nausea or vomiting.   traZODone 50 MG tablet Commonly known as:  DESYREL Take 0.5 tablets (25 mg total) by mouth at bedtime as needed for sleep.   vitamin B-12 500 MCG tablet Commonly known as:  CYANOCOBALAMIN Take 500 mcg by mouth daily.        DISCHARGE INSTRUCTIONS:   1. PCP follow-up in 2 weeks 2. Hospice follow-up  DIET:   Regular diet  ACTIVITY:   Activity as tolerated  OXYGEN:   Home Oxygen: No.  Oxygen Delivery: room air  DISCHARGE LOCATION:   Assisted living with hospice   If you experience worsening of your admission symptoms, develop shortness of breath, life threatening emergency, suicidal or homicidal thoughts you must seek medical attention immediately by calling 911 or calling your MD immediately  if symptoms less severe.  You Must read complete instructions/literature along with all the possible adverse reactions/side effects for all the Medicines you take and that have been prescribed to you. Take any new Medicines after you have completely  understood and accpet all the possible adverse reactions/side effects.   Please note  You were cared for by a hospitalist during your hospital stay. If you have any questions about your discharge medications or the care you received while you were in the hospital after you are discharged, you can call the unit and asked to speak with the hospitalist on call if the hospitalist that took care of you is not available. Once you are discharged, your primary care physician will handle any further medical issues. Please note that NO REFILLS for any discharge medications will be authorized once you are discharged, as it is imperative that you return to your primary care physician (or establish a relationship with a primary care physician if you do not have one) for your aftercare needs so that they can reassess your need for medications and monitor your lab values.    On the day of Discharge:  VITAL SIGNS:   Blood pressure (!) 156/64, pulse 79, temperature 97.6 F (36.4 C), temperature source Oral, resp. rate 20, height  (1.651 m), weight 74.9 kg (165 lb 3.2 oz), SpO2 97 %.  PHYSICAL EXAMINATION:   GENERAL:  83 y.o.-year-old Elderly patient lying in the bed with no acute distress.  EYES: Pupils equal, round, reactive to light and accommodation. No scleral icterus. Extraocular muscles intact.  HEENT: Head atraumatic, normocephalic. Oropharynx and nasopharynx clear.  NECK:  Supple, no jugular venous distention. No thyroid enlargement, no tenderness.  LUNGS: Normal breath sounds bilaterally, no wheezing, rales,rhonchi or crepitation. No use of accessory muscles of respiration.  CARDIOVASCULAR: S1, S2 normal. No rubs, or gallops. 3/6 systolic murmur present ABDOMEN: Soft, nontender, nondistended. Bowel sounds present. No organomegaly or mass.  EXTREMITIES: No pedal edema, cyanosis, or clubbing.  NEUROLOGIC: Alert, able to move all four extremities, not following commands.  PSYCHIATRIC: The patient  is Alert but oriented to self. More calmer today SKIN: No obvious rash, lesion, or ulcer.   DATA REVIEW:   CBC  Recent Labs Lab 06/29/16 1532  WBC 7.1  HGB 15.3  HCT 44.7  PLT 203    Chemistries   Recent Labs Lab 06/29/16 1532  NA 143  K 2.9*  CL 106  CO2 26  GLUCOSE 108*  BUN 13  CREATININE 0.93  CALCIUM 9.4  AST 23  ALT 6*  ALKPHOS 46  BILITOT 0.9     Microbiology Results  Results for orders placed or performed during the hospital encounter of 06/29/16  MRSA PCR Screening     Status: None   Collection Time: 06/29/16  1:49 PM  Result Value Ref Range Status   MRSA by PCR NEGATIVE NEGATIVE Final    Comment:        The GeneXpert MRSA Assay (FDA approved for NASAL specimens only), is one component of a comprehensive MRSA colonization surveillance program. It is not intended to diagnose MRSA infection nor to guide or monitor treatment for MRSA infections.   Urine culture     Status: Abnormal   Collection Time: 06/29/16  3:32 PM  Result Value Ref Range Status   Specimen Description URINE, CATHETERIZED  Final   Special Requests NONE  Final   Culture MULTIPLE SPECIES PRESENT, SUGGEST RECOLLECTION (A)  Final   Report Status 07/01/2016 FINAL  Final    RADIOLOGY:  No results found.   Management plans discussed with the patient, family and they are in agreement.  CODE STATUS:     Code Status Orders        Start     Ordered   06/29/16 1727  Do not attempt resuscitation (DNR)  Continuous    Question Answer Comment  In the event of cardiac or respiratory ARREST Do not call a "code blue"   In the event of cardiac or respiratory ARREST Do not perform Intubation, CPR, defibrillation or ACLS   In the event of cardiac or respiratory ARREST Use medication by any route, position, wound care, and other measures to relive pain and suffering. May use oxygen, suction and manual treatment of airway obstruction as needed for comfort.      06/29/16 1728    Code  Status History    Date Active Date Inactive Code Status Order ID Comments User Context   06/28/2015  2:18 PM 06/29/2015  6:16 PM Full Code [id number]  Katha Hamming, MD ED   01/01/2015  4:38 PM 01/03/2015  7:45 PM Full Code [id number]  Auburn Bilberry, MD ED    Advance Directive Documentation     Most Recent Value  Type of Advance Directive  Healthcare Power of Attorney, Living will  Pre-existing out of facility DNR order (yellow form or pink MOST form)  -  "MOST" Form in Place?  -      TOTAL TIME TAKING CARE OF THIS PATIENT: 39 minutes.    Enid Baas M.D on 07/02/2016 at 12:47 PM  Between 7am to 6pm - Pager - [phone]  After 6pm go to [email or web address] - Social research officer, government  [facility] [place] [facility]  Office  [phone]  CC: Primary care physician; PROVIDER NOT IN SYSTEM   Note: This dictation was prepared with Dragon dictation along with smaller phrase technology. Any transcriptional errors that result from this process are unintentional.

## 2016-07-02 NOTE — Progress Notes (Signed)
Clinical Social Worker (CSW) met with patient's daughter [name] and son Richardson Landry at bedside to discuss D/C plan. It was decided that patient will return to [facility] with hospice care. CSW provided hospice agency choice and family chose [facility]. Family requested a hospital bed. [facility] hospice liaison is aware of above.   Patient is medically stable for D/C back to [facility] today. Per Scientist, research (life sciences) at [employer] patient can return today with hospice care. CSW made Dustin aware that patient can't get up from the bed. RN will call report and arrange EMS for transport. CSW sent D/C orders to [facility]. Please reconsult if future social work needs arise. CSW signing off.   [employer], LCSW [phone]

## 2016-07-02 NOTE — Progress Notes (Signed)
Patient report called to [facility] at the [facility].

## 2016-07-08 ENCOUNTER — Emergency Department
Admission: EM | Admit: 2016-07-08 | Discharge: 2016-07-08 | Disposition: A | Discharge status: Home / Self Care | Attending: Emergency Medicine | Admitting: Emergency Medicine

## 2016-07-08 ENCOUNTER — Emergency Department

## 2016-07-08 DIAGNOSIS — Y999 Unspecified external cause status: Secondary | ICD-10-CM | POA: Insufficient documentation

## 2016-07-08 DIAGNOSIS — I10 Essential (primary) hypertension: Secondary | ICD-10-CM | POA: Diagnosis not present

## 2016-07-08 DIAGNOSIS — J449 Chronic obstructive pulmonary disease, unspecified: Secondary | ICD-10-CM | POA: Insufficient documentation

## 2016-07-08 DIAGNOSIS — Z7982 Long term (current) use of aspirin: Secondary | ICD-10-CM | POA: Diagnosis not present

## 2016-07-08 DIAGNOSIS — W19XXXA Unspecified fall, initial encounter: Secondary | ICD-10-CM | POA: Insufficient documentation

## 2016-07-08 DIAGNOSIS — M545 Low back pain: Secondary | ICD-10-CM | POA: Insufficient documentation

## 2016-07-08 DIAGNOSIS — S3992XA Unspecified injury of lower back, initial encounter: Secondary | ICD-10-CM | POA: Diagnosis present

## 2016-07-08 DIAGNOSIS — Y92129 Unspecified place in nursing home as the place of occurrence of the external cause: Secondary | ICD-10-CM | POA: Diagnosis not present

## 2016-07-08 DIAGNOSIS — Z79899 Other long term (current) drug therapy: Secondary | ICD-10-CM | POA: Insufficient documentation

## 2016-07-08 DIAGNOSIS — Y939 Activity, unspecified: Secondary | ICD-10-CM | POA: Diagnosis not present

## 2016-07-08 MED ORDER — ACETAMINOPHEN 500 MG PO TABS
1000.0000 mg | ORAL_TABLET | Freq: Once | ORAL | Status: AC
  Administered 2016-07-08: 1000 mg via ORAL
  Filled 2016-07-08: qty 2

## 2016-07-08 NOTE — ED Notes (Signed)
Attempted to call report to [facility] three times. Unable to reach staff, phone rings busy each time.

## 2016-07-08 NOTE — ED Notes (Signed)
Called "[facility]" and let staff know pt. Was returning.

## 2016-07-08 NOTE — ED Notes (Signed)
Pt. Currently stay at [facility] [place], [address]

## 2016-07-08 NOTE — ED Notes (Signed)
Pt. Crying out from room.  Pt. Did not like noise she was seeing.  Monitor turned off.  Pt. Also started "to much light in my eyes".  Lights shut off, pt. Resting comfortable.

## 2016-07-08 NOTE — ED Notes (Signed)
Patient transported to X-ray 

## 2016-07-08 NOTE — ED Provider Notes (Signed)
[facility] Emergency Department Provider Note  Time seen: 5:01 PM  I have reviewed the triage vital signs and the nursing notes.   HISTORY  Chief Complaint Fall    HPI Cynthia Glenn is a 83 y.o. female with a past medical history of dementia, CVA, gastric reflux, hypertension, hyperlipidemia, presents to the emergency department after an unwitnessed fall at her nursing facility. According to EMS report patient was found down at a nursing facility. It is unclear the circumstances surrounding the fall. Patient has dementia and cannot contribute to her history however she denies any complaints at this time. Per EMS report at the nursing home the patient was complaining of back pain. It is not clear if she had her head. Currently the patient is calm, cooperative, denies any pain and follows commands appropriately.  Past Medical History:  Diagnosis Date  . Arthritis   . COPD (chronic obstructive pulmonary disease) (HCC)   . CVA (cerebral infarction)   . Dementia   . Dementia   . Essential tremor   . GERD (gastroesophageal reflux disease)   . HTN (hypertension)   . Hyperlipemia   . Hypertension   . Polycystic kidney disease   . PVD (peripheral vascular disease) (HCC)   . Seasonal allergies   . Shingles   . Vitamin B12 deficiency   . Vitamin D deficiency     Patient Active Problem List   Diagnosis Date Noted  . UTI (urinary tract infection) 06/29/2016  . Bradycardia 06/28/2015  . Cellulitis and abscess of neck 01/03/2015  . Submandibular abscess 01/03/2015    Past Surgical History:  Procedure Laterality Date  . CATARACT EXTRACTION    . COLON SURGERY    . HERNIA REPAIR    . HYSTEROTOMY    . ORIF ANKLE FRACTURE      Prior to Admission medications   Medication Sig Start Date End Date Taking? Authorizing Provider  acetaminophen (TYLENOL) 500 MG tablet Take 500 mg by mouth 2 (two) times daily.    Historical Provider, MD  albuterol (PROVENTIL)  (2.5 MG/3ML) 0.083% nebulizer solution Take 2.5 mg by nebulization every 6 (six) hours as needed for wheezing or shortness of breath.    Historical Provider, MD  amLODipine (NORVASC) 10 MG tablet Take 10 mg by mouth daily.    Historical Provider, MD  aspirin 81 MG tablet Take 81 mg by mouth daily.    Historical Provider, MD  atenolol (TENORMIN) 50 MG tablet Take 50 mg by mouth daily.    Historical Provider, MD  busPIRone (BUSPAR) 15 MG tablet Take 15 mg by mouth 3 (three) times daily.    Historical Provider, MD  calcium-vitamin D (OSCAL WITH D) 500-200 MG-UNIT tablet Take 1 tablet by mouth.    Historical Provider, MD  cefdinir (OMNICEF) 300 MG capsule Take 1 capsule (300 mg total) by mouth 2 (two) times daily. X 5 more days 07/02/16   Enid Baas, MD  Cholecalciferol 2000 units TABS Take 2,000 Units by mouth daily.    Historical Provider, MD  docusate sodium (COLACE) 100 MG capsule Take 100 mg by mouth 2 (two) times daily as needed for mild constipation.    Historical Provider, MD  donepezil (ARICEPT) 10 MG tablet Take 10 mg by mouth at bedtime.    Historical Provider, MD  fluticasone (FLONASE) 50 MCG/ACT nasal spray Place 2 sprays into both nostrils daily.     Historical Provider, MD  lactobacillus acidophilus (BACID) TABS tablet Take 2 tablets by mouth  3 (three) times daily.    Historical Provider, MD  LORazepam (ATIVAN) 1 MG tablet Take 1 tablet (1 mg total) by mouth every 8 (eight) hours. 07/02/16   Enid Baas, MD  memantine (NAMENDA) 10 MG tablet Take 10 mg by mouth 2 (two) times daily.    Historical Provider, MD  morphine (ROXANOL) 20 MG/ML concentrated solution Take 0.5 mLs (10 mg total) by mouth every 4 (four) hours as needed for severe pain. 07/02/16   Enid Baas, MD  ondansetron (ZOFRAN) 4 MG tablet Take 4 mg by mouth every 8 (eight) hours as needed for nausea or vomiting.    Historical Provider, MD  Skin Protectants, Misc. (MINERIN) CREA Apply 1 application topically 2 (two)  times daily.    Historical Provider, MD  traZODone (DESYREL) 50 MG tablet Take 0.5 tablets (25 mg total) by mouth at bedtime as needed for sleep. 07/02/16   Enid Baas, MD  vitamin B-12 (CYANOCOBALAMIN) 500 MCG tablet Take 500 mcg by mouth daily.    Historical Provider, MD    Allergies  Allergen Reactions  . Lisinopril Cough  . Statins Hives  . Ciprofloxacin     Family History  Problem Relation Age of Onset  . Hypertension      Social History Social History  Substance Use Topics  . Smoking status: Never Smoker  . Smokeless tobacco: Never Used  . Alcohol use No    Review of Systems Unable to obtain an accurate/adequate review of systems due to dementia  ____________________________________________   PHYSICAL EXAM:  VITAL SIGNS: ED Triage Vitals  Enc Vitals Group     BP 07/08/16 1658 (!) 142/58     Pulse Rate 07/08/16 1658 74     Resp 07/08/16 1658 18     Temp 07/08/16 1658 97.7 F (36.5 C)     Temp Source 07/08/16 1658 Oral     SpO2 07/08/16 1658 98 %     Weight 07/08/16 1655 165 lb (74.8 kg)     Height 07/08/16 1655  (1.651 m)     Head Circumference --      Peak Flow --      Pain Score --      Pain Loc --      Pain Edu? --      Excl. in GC? --     Constitutional: Alert, no distress. Overall well-appearing. No complaints at this time. Eyes: Normal exam ENT   Head: Normocephalic and atraumatic. No C-spine tenderness.   Mouth/Throat: Mucous membranes are moist. Cardiovascular: Normal rate, regular rhythm.  Respiratory: Normal respiratory effort without tachypnea nor retractions. Breath sounds are clear  Gastrointestinal: Soft and nontender. No distention.   Musculoskeletal: Nontender with normal range of motion in all extremities. Stable pelvis. No pain elicited with range of motion in any extremity. No C-spine, T-spine, or L-spine tenderness. No back deformity or ecchymosis noted. Patient is able to sit up with assistance but no pain sitting  up. Neurologic:  Normal speech and language. Patient moves all extremities, no gross deficits on exam. Skin:  Skin is warm, dry and intact.  Psychiatric: Mood and affect are normal.   ___________________________________________  RADIOLOGY  X-rays are negative. Patient will be discharged back to her nursing facility.  ____________________________________________   INITIAL IMPRESSION / ASSESSMENT AND PLAN / ED COURSE  Pertinent labs & imaging results that were available during my care of the patient were reviewed by me and considered in my medical decision making (see chart for details).  Patient presents to the emergency department after a fall at her nursing facility. Initially per EMS the patient was complaining of back pain.  We will obtain x-rays of the back to further evaluate. No signs of head trauma. No C-spine tenderness.  X-rays are negative. Patient continues to be in no distress with no complaints. We'll discharge back to her facility.  ____________________________________________   FINAL CLINICAL IMPRESSION(S) / ED DIAGNOSES  Thresa Ross, MD 07/08/16 1821

## 2016-07-08 NOTE — ED Notes (Signed)
Called [facility]" to let them know patient will be returning to facility via [facility].

## 2016-07-08 NOTE — ED Triage Notes (Signed)
Pt brought by [facility] from [facility] with report of a unwitnessed fall. Pt reports back pain. Vitals: BP 129/57, HR-68, 96% room air. BS-121

## 2016-07-08 NOTE — ED Notes (Signed)
Disregard pulse rate on "19:00" time frame entered in error.

## 2016-08-05 ENCOUNTER — Emergency Department
Admission: EM | Admit: 2016-08-05 | Discharge: 2016-08-05 | Disposition: A | Discharge status: Skilled Nursing Facility | Attending: Emergency Medicine | Admitting: Emergency Medicine

## 2016-08-05 ENCOUNTER — Encounter: Payer: Self-pay | Admitting: Emergency Medicine

## 2016-08-05 ENCOUNTER — Emergency Department

## 2016-08-05 DIAGNOSIS — Z8673 Personal history of transient ischemic attack (TIA), and cerebral infarction without residual deficits: Secondary | ICD-10-CM | POA: Diagnosis not present

## 2016-08-05 DIAGNOSIS — W19XXXA Unspecified fall, initial encounter: Secondary | ICD-10-CM

## 2016-08-05 DIAGNOSIS — I10 Essential (primary) hypertension: Secondary | ICD-10-CM | POA: Insufficient documentation

## 2016-08-05 DIAGNOSIS — Y999 Unspecified external cause status: Secondary | ICD-10-CM | POA: Insufficient documentation

## 2016-08-05 DIAGNOSIS — Z79899 Other long term (current) drug therapy: Secondary | ICD-10-CM | POA: Insufficient documentation

## 2016-08-05 DIAGNOSIS — F0390 Unspecified dementia without behavioral disturbance: Secondary | ICD-10-CM | POA: Diagnosis not present

## 2016-08-05 DIAGNOSIS — N390 Urinary tract infection, site not specified: Secondary | ICD-10-CM | POA: Diagnosis not present

## 2016-08-05 DIAGNOSIS — Y92129 Unspecified place in nursing home as the place of occurrence of the external cause: Secondary | ICD-10-CM | POA: Insufficient documentation

## 2016-08-05 DIAGNOSIS — Y939 Activity, unspecified: Secondary | ICD-10-CM | POA: Diagnosis not present

## 2016-08-05 DIAGNOSIS — W1830XA Fall on same level, unspecified, initial encounter: Secondary | ICD-10-CM | POA: Insufficient documentation

## 2016-08-05 DIAGNOSIS — J449 Chronic obstructive pulmonary disease, unspecified: Secondary | ICD-10-CM | POA: Diagnosis not present

## 2016-08-05 DIAGNOSIS — W1789XA Other fall from one level to another, initial encounter: Secondary | ICD-10-CM | POA: Insufficient documentation

## 2016-08-05 DIAGNOSIS — T1490XA Injury, unspecified, initial encounter: Secondary | ICD-10-CM | POA: Diagnosis present

## 2016-08-05 LAB — URINALYSIS, COMPLETE (UACMP) WITH MICROSCOPIC
Bilirubin Urine: NEGATIVE
GLUCOSE, UA: NEGATIVE mg/dL
HGB URINE DIPSTICK: NEGATIVE
KETONES UR: NEGATIVE mg/dL
Leukocytes, UA: NEGATIVE
NITRITE: POSITIVE — AB
PROTEIN: 100 mg/dL — AB
Specific Gravity, Urine: 1.018 (ref 1.005–1.030)
pH: 5 (ref 5.0–8.0)

## 2016-08-05 LAB — CBC WITH DIFFERENTIAL/PLATELET
BASOS PCT: 1 %
Basophils Absolute: 0 10*3/uL (ref 0–0.1)
Eosinophils Absolute: 0.2 10*3/uL (ref 0–0.7)
Eosinophils Relative: 3 %
HCT: 43.5 % (ref 35.0–47.0)
HEMOGLOBIN: 14.6 g/dL (ref 12.0–16.0)
Lymphocytes Relative: 17 %
Lymphs Abs: 0.9 10*3/uL — ABNORMAL LOW (ref 1.0–3.6)
MCH: 31.4 pg (ref 26.0–34.0)
MCHC: 33.7 g/dL (ref 32.0–36.0)
MCV: 93.1 fL (ref 80.0–100.0)
Monocytes Absolute: 0.5 10*3/uL (ref 0.2–0.9)
Monocytes Relative: 10 %
Neutro Abs: 3.7 10*3/uL (ref 1.4–6.5)
Neutrophils Relative %: 69 %
Platelets: 203 10*3/uL (ref 150–440)
RBC: 4.67 MIL/uL (ref 3.80–5.20)
RDW: 14.9 % — ABNORMAL HIGH (ref 11.5–14.5)
WBC: 5.4 10*3/uL (ref 3.6–11.0)

## 2016-08-05 LAB — COMPREHENSIVE METABOLIC PANEL
ALK PHOS: 56 U/L (ref 38–126)
ALT: 13 U/L — ABNORMAL LOW (ref 14–54)
AST: 20 U/L (ref 15–41)
Albumin: 4.2 g/dL (ref 3.5–5.0)
Anion gap: 8 (ref 5–15)
BILIRUBIN TOTAL: 0.9 mg/dL (ref 0.3–1.2)
BUN: 17 mg/dL (ref 6–20)
CALCIUM: 9.5 mg/dL (ref 8.9–10.3)
CO2: 27 mmol/L (ref 22–32)
Chloride: 108 mmol/L (ref 101–111)
Creatinine, Ser: 0.89 mg/dL (ref 0.44–1.00)
GFR calc non Af Amer: 54 mL/min — ABNORMAL LOW (ref >60)
Glucose, Bld: 104 mg/dL — ABNORMAL HIGH (ref 65–99)
Potassium: 3.3 mmol/L — ABNORMAL LOW (ref 3.5–5.1)
Sodium: 143 mmol/L (ref 135–145)
TOTAL PROTEIN: 7 g/dL (ref 6.5–8.1)

## 2016-08-05 LAB — TROPONIN I: Troponin I: <0.03 ng/mL (ref <0.03)

## 2016-08-05 MED ORDER — CEFTRIAXONE SODIUM IN DEXTROSE 20 MG/ML IV SOLN
1.0000 g | Freq: Once | INTRAVENOUS | Status: AC
  Administered 2016-08-05: 1 g via INTRAVENOUS
  Filled 2016-08-05: qty 50

## 2016-08-05 MED ORDER — CEPHALEXIN 500 MG PO CAPS
500.0000 mg | ORAL_CAPSULE | Freq: Three times a day (TID) | ORAL | 0 refills | Status: AC
Start: 2016-08-05 — End: 2016-08-10

## 2016-08-05 NOTE — ED Triage Notes (Signed)
EMS pt to RM 19 h from [facility] [place] with report of unwitnessed fall. EM reports pt with hx of dementia, DDD and prior neck and back pains and now co pain to her neck and back.

## 2016-08-05 NOTE — Discharge Instructions (Signed)
Please take all of your antibiotics as prescribed and follow up with her primary care physician on Monday for recheck. Return to the emergency department sooner for any concerns.  It was a pleasure to take care of you today, and thank you for coming to our emergency department.  If you have any questions or concerns before leaving please ask the nurse to grab me and I'm more than happy to go through your aftercare instructions again.  If you were prescribed any opioid pain medication today such as Norco, Vicodin, Percocet, morphine, hydrocodone, or oxycodone please make sure you do not drive when you are taking this medication as it can alter your ability to drive safely.  If you have any concerns once you are home that you are not improving or are in fact getting worse before you can make it to your follow-up appointment, please do not hesitate to call 911 and come back for further evaluation.  Merrily Brittle MD  Results for orders placed or performed during the hospital encounter of 08/05/16  Comprehensive metabolic panel  Result Value Ref Range   Sodium 143 135 - 145 mmol/L   Potassium 3.3 (L) 3.5 - 5.1 mmol/L   Chloride 108 101 - 111 mmol/L   CO2 27 22 - 32 mmol/L   Glucose, Bld 104 (H) 65 - 99 mg/dL   BUN 17 6 - 20 mg/dL   Creatinine, Ser 4.54 0.44 - 1.00 mg/dL   Calcium 9.5 8.9 - 09.8 mg/dL   Total Protein 7.0 6.5 - 8.1 g/dL   Albumin 4.2 3.5 - 5.0 g/dL   AST 20 15 - 41 U/L   ALT 13 (L) 14 - 54 U/L   Alkaline Phosphatase 56 38 - 126 U/L   Total Bilirubin 0.9 0.3 - 1.2 mg/dL   GFR calc non Af Amer 54 (L) >60 mL/min   GFR calc Af Amer >60 >60 mL/min   Anion gap 8 5 - 15  Troponin I  Result Value Ref Range   Troponin I <0.03 <0.03 ng/mL  CBC with Differential  Result Value Ref Range   WBC 5.4 3.6 - 11.0 K/uL   RBC 4.67 3.80 - 5.20 MIL/uL   Hemoglobin 14.6 12.0 - 16.0 g/dL   HCT 11.9 14.7 - 82.9 %   MCV 93.1 80.0 - 100.0 fL   MCH 31.4 26.0 - 34.0 pg   MCHC 33.7 32.0 - 36.0  g/dL   RDW 56.2 (H) 13.0 - 86.5 %   Platelets 203 150 - 440 K/uL   Neutrophils Relative % 69 %   Neutro Abs 3.7 1.4 - 6.5 K/uL   Lymphocytes Relative 17 %   Lymphs Abs 0.9 (L) 1.0 - 3.6 K/uL   Monocytes Relative 10 %   Monocytes Absolute 0.5 0.2 - 0.9 K/uL   Eosinophils Relative 3 %   Eosinophils Absolute 0.2 0 - 0.7 K/uL   Basophils Relative 1 %   Basophils Absolute 0.0 0 - 0.1 K/uL  Urinalysis, Complete w Microscopic  Result Value Ref Range   Color, Urine YELLOW (A) YELLOW   APPearance HAZY (A) CLEAR   Specific Gravity, Urine 1.018 1.005 - 1.030   pH 5.0 5.0 - 8.0   Glucose, UA NEGATIVE NEGATIVE mg/dL   Hgb urine dipstick NEGATIVE NEGATIVE   Bilirubin Urine NEGATIVE NEGATIVE   Ketones, ur NEGATIVE NEGATIVE mg/dL   Protein, ur 784 (A) NEGATIVE mg/dL   Nitrite POSITIVE (A) NEGATIVE   Leukocytes, UA NEGATIVE NEGATIVE   RBC /  HPF 0-5 0 - 5 RBC/hpf   WBC, UA 0-5 0 - 5 WBC/hpf   Bacteria, UA MANY (A) NONE SEEN   Squamous Epithelial / LPF 0-5 (A) NONE SEEN   Mucous PRESENT    Dg Chest 1 View  Result Date: 08/05/2016 CLINICAL DATA:  Status post unwitnessed fall. Concern for chest injury. Initial encounter. EXAM: CHEST 1 VIEW COMPARISON:  Chest radiograph performed 06/29/2016 FINDINGS: The lungs are well-aerated. Mild left basilar opacity may reflect atelectasis or possibly mild infection. There is no evidence of pleural effusion or pneumothorax. The cardiomediastinal silhouette is within normal limits. No acute osseous abnormalities are seen. Diffuse calcification is seen along the thoracic and proximal abdominal aorta. IMPRESSION: 1. Mild left basilar airspace opacity may reflect atelectasis or possibly mild infection. 2. No displaced rib fracture seen. 3. Diffuse aortic atherosclerosis. Electronically Signed   By: Roanna RaiderJeffery  Chang M.D.   On: 08/05/2016 02:17   Dg Thoracic Spine 2 View  Result Date: 07/08/2016 CLINICAL DATA:  Unwitnessed fall today. EXAM: THORACIC SPINE 2 VIEWS  COMPARISON:  12/17/2014 chest radiographs FINDINGS: Mild lower thoracic vertebral compression fracture, probably T11, unchanged since 12/17/2014. Remaining thoracic vertebral body heights appear preserved, with no acute fracture or subluxation. No suspicious focal osseous lesion. Mild-to-moderate spondylosis throughout the thoracic spine. Aortic atherosclerosis. IMPRESSION: 1. Stable chronic mild lower thoracic vertebral compression fracture, probably T11. 2. No acute thoracic spine fracture or subluxation . 3. Mild-to-moderate thoracic spondylosis . 4. Aortic atherosclerosis. Electronically Signed   By: Delbert PhenixJason A Poff M.D.   On: 07/08/2016 18:13   Dg Lumbar Spine Complete  Result Date: 07/08/2016 CLINICAL DATA:  Un- witnessed fall today EXAM: LUMBAR SPINE - COMPLETE 4+ VIEW COMPARISON:  09/08/2014 FINDINGS: Five views of the lumbar spine submitted. No acute fracture or subluxation. There is diffuse osteopenia. Stable mild compression deformity upper endplate of T12 vertebral body. There is moderate disc space flattening with anterior spurring at L1-L2 and L2-L3 level. Mild disc space flattening with vacuum disc phenomenon at L3-L4 level. Significant disc space flattening with vacuum disc phenomenon at L4-L5 level. Mild anterior spurring at L4-L5 level. Minimal disc space flattening at L5-S1 level. Facet degenerative changes L4 and L5 level. Extensive atherosclerotic calcifications of abdominal aorta and iliac arteries are noted. IMPRESSION: No acute fracture or subluxation. Multilevel degenerative changes as described above. Stable old fracture upper endplate of T12 vertebral body. Extensive atherosclerotic calcifications of abdominal aorta and iliac arteries. Electronically Signed   By: Natasha MeadLiviu  Pop M.D.   On: 07/08/2016 18:13   Ct Head Wo Contrast  Result Date: 08/05/2016 CLINICAL DATA:  Status post unwitnessed fall. Neck pain. Concern for head injury. Initial encounter. EXAM: CT HEAD WITHOUT CONTRAST CT  CERVICAL SPINE WITHOUT CONTRAST TECHNIQUE: Multidetector CT imaging of the head and cervical spine was performed following the standard protocol without intravenous contrast. Multiplanar CT image reconstructions of the cervical spine were also generated. COMPARISON:  CT of the head performed 06/29/2016, and CT of the cervical spine performed 06/20/2016. CT of the cervical spine performed 03/21/2011 FINDINGS: CT HEAD FINDINGS Brain: No evidence of acute infarction, hemorrhage, hydrocephalus, extra-axial collection or mass lesion/mass effect. Prominence of the ventricles and sulci reflects moderate cortical volume loss. Cerebellar atrophy is noted. Scattered periventricular and subcortical white matter change likely reflects small vessel ischemic microangiopathy. Chronic ischemic change is seen at the basal ganglia bilaterally. The brainstem and fourth ventricle are within normal limits. The cerebral hemispheres demonstrate grossly normal gray-white differentiation. No mass effect or midline shift  is seen. Vascular: No hyperdense vessel or unexpected calcification. Skull: There is no evidence of fracture; visualized osseous structures are unremarkable in appearance. Sinuses/Orbits: The orbits are within normal limits. The paranasal sinuses and mastoid air cells are well-aerated. Other: No significant soft tissue abnormalities are seen. CT CERVICAL SPINE FINDINGS Alignment: There is mild grade 1 anterolisthesis of C2 on C3, of C3 on C4, and of C4 on C5. Mild underlying facet disease is noted. There is chronic osseous fusion at the posterior elements of C4-C5. Skull base and vertebrae: No acute fracture. No primary bone lesion or focal pathologic process. Soft tissues and spinal canal: No prevertebral fluid or swelling. No visible canal hematoma. Disc levels: Multilevel disc space narrowing is noted along the lower cervical spine, with scattered small anterior and posterior disc osteophyte complexes. Mild degenerative  change is noted about the dens. Upper chest: There is suggestion of a 2.8 cm exophytic nodule arising from the inferior aspect of the left thyroid lobe. This is grossly stable from 2012 and likely benign. The visualized lung apices are clear. Scattered calcification is noted along the aortic arch and proximal great vessels, and at the carotid bifurcations bilaterally. Other: No additional soft tissue abnormalities are seen. IMPRESSION: 1. No evidence of traumatic intracranial injury or fracture. 2. No evidence of acute fracture or subluxation along the cervical spine. 3. Moderate cortical volume loss and scattered small vessel ischemic microangiopathy. 4. Chronic ischemic change at the basal ganglia bilaterally. 5. Mild degenerative change along the cervical spine. 6. 2.8 cm exophytic nodule at the inferior aspect of the left thyroid lobe is grossly stable from 2012 and likely benign. 7. Scattered calcification along the carotid bifurcations bilaterally. Carotid ultrasound would be helpful for further evaluation, when and if deemed clinically appropriate. Electronically Signed   By: Roanna Raider M.D.   On: 08/05/2016 02:30   Ct Cervical Spine Wo Contrast  Result Date: 08/05/2016 CLINICAL DATA:  Status post unwitnessed fall. Neck pain. Concern for head injury. Initial encounter. EXAM: CT HEAD WITHOUT CONTRAST CT CERVICAL SPINE WITHOUT CONTRAST TECHNIQUE: Multidetector CT imaging of the head and cervical spine was performed following the standard protocol without intravenous contrast. Multiplanar CT image reconstructions of the cervical spine were also generated. COMPARISON:  CT of the head performed 06/29/2016, and CT of the cervical spine performed 06/20/2016. CT of the cervical spine performed 03/21/2011 FINDINGS: CT HEAD FINDINGS Brain: No evidence of acute infarction, hemorrhage, hydrocephalus, extra-axial collection or mass lesion/mass effect. Prominence of the ventricles and sulci reflects moderate  cortical volume loss. Cerebellar atrophy is noted. Scattered periventricular and subcortical white matter change likely reflects small vessel ischemic microangiopathy. Chronic ischemic change is seen at the basal ganglia bilaterally. The brainstem and fourth ventricle are within normal limits. The cerebral hemispheres demonstrate grossly normal gray-white differentiation. No mass effect or midline shift is seen. Vascular: No hyperdense vessel or unexpected calcification. Skull: There is no evidence of fracture; visualized osseous structures are unremarkable in appearance. Sinuses/Orbits: The orbits are within normal limits. The paranasal sinuses and mastoid air cells are well-aerated. Other: No significant soft tissue abnormalities are seen. CT CERVICAL SPINE FINDINGS Alignment: There is mild grade 1 anterolisthesis of C2 on C3, of C3 on C4, and of C4 on C5. Mild underlying facet disease is noted. There is chronic osseous fusion at the posterior elements of C4-C5. Skull base and vertebrae: No acute fracture. No primary bone lesion or focal pathologic process. Soft tissues and spinal canal: No prevertebral fluid or swelling.  No visible canal hematoma. Disc levels: Multilevel disc space narrowing is noted along the lower cervical spine, with scattered small anterior and posterior disc osteophyte complexes. Mild degenerative change is noted about the dens. Upper chest: There is suggestion of a 2.8 cm exophytic nodule arising from the inferior aspect of the left thyroid lobe. This is grossly stable from 2012 and likely benign. The visualized lung apices are clear. Scattered calcification is noted along the aortic arch and proximal great vessels, and at the carotid bifurcations bilaterally. Other: No additional soft tissue abnormalities are seen. IMPRESSION: 1. No evidence of traumatic intracranial injury or fracture. 2. No evidence of acute fracture or subluxation along the cervical spine. 3. Moderate cortical volume  loss and scattered small vessel ischemic microangiopathy. 4. Chronic ischemic change at the basal ganglia bilaterally. 5. Mild degenerative change along the cervical spine. 6. 2.8 cm exophytic nodule at the inferior aspect of the left thyroid lobe is grossly stable from 2012 and likely benign. 7. Scattered calcification along the carotid bifurcations bilaterally. Carotid ultrasound would be helpful for further evaluation, when and if deemed clinically appropriate. Electronically Signed   By: Roanna Raider M.D.   On: 08/05/2016 02:30

## 2016-08-05 NOTE — ED Provider Notes (Signed)
[facility] Emergency Department Provider Note  ____________________________________________   First MD Initiated Contact with Patient 08/05/16 0151     (approximate)  I have reviewed the triage vital signs and the nursing notes.   HISTORY  Chief Complaint Fall  Level V exemption history Limited by the patient's dementia  HPI Cynthia Glenn is a 83 y.o. female who is brought to the emergency department via EMS after being found down in her nursing home. The patient is DNR/DNI. She has severe dementia and is unable to provide any history.   Past Medical History:  Diagnosis Date  . Arthritis   . COPD (chronic obstructive pulmonary disease) (HCC)   . CVA (cerebral infarction)   . Dementia   . Dementia   . Essential tremor   . GERD (gastroesophageal reflux disease)   . HTN (hypertension)   . Hyperlipemia   . Hypertension   . Polycystic kidney disease   . PVD (peripheral vascular disease) (HCC)   . Seasonal allergies   . Shingles   . Vitamin B12 deficiency   . Vitamin D deficiency     Patient Active Problem List   Diagnosis Date Noted  . UTI (urinary tract infection) 06/29/2016  . Bradycardia 06/28/2015  . Cellulitis and abscess of neck 01/03/2015  . Submandibular abscess 01/03/2015    Past Surgical History:  Procedure Laterality Date  . CATARACT EXTRACTION    . COLON SURGERY    . HERNIA REPAIR    . HYSTEROTOMY    . ORIF ANKLE FRACTURE      Prior to Admission medications   Medication Sig Start Date End Date Taking? Authorizing Provider  acetaminophen (TYLENOL) 500 MG tablet Take 500 mg by mouth 2 (two) times daily.    [provider]  albuterol (PROVENTIL) (2.5 MG/3ML) 0.083% nebulizer solution Take 2.5 mg by nebulization every 6 (six) hours as needed for wheezing or shortness of breath.    [provider]  amLODipine (NORVASC) 10 MG tablet Take 10 mg by mouth daily.    [provider]  aspirin 81 MG  tablet Take 81 mg by mouth daily.    [provider]  atenolol (TENORMIN) 50 MG tablet Take 50 mg by mouth daily.    [provider]  busPIRone (BUSPAR) 15 MG tablet Take 15 mg by mouth 3 (three) times daily.    [provider]  calcium-vitamin D (OSCAL WITH D) 500-200 MG-UNIT tablet Take 1 tablet by mouth.    [provider]  cefdinir (OMNICEF) 300 MG capsule Take 1 capsule (300 mg total) by mouth 2 (two) times daily. X 5 more days 07/02/16   Enid Baas, MD  cephALEXin (KEFLEX) 500 MG capsule Take 1 capsule (500 mg total) by mouth 3 (three) times daily. 08/05/16 08/10/16  Merrily Brittle, MD  Cholecalciferol 2000 units TABS Take 2,000 Units by mouth daily.    [provider]  docusate sodium (COLACE) 100 MG capsule Take 100 mg by mouth 2 (two) times daily as needed for mild constipation.    [provider]  donepezil (ARICEPT) 10 MG tablet Take 10 mg by mouth at bedtime.    [provider]  fluticasone (FLONASE) 50 MCG/ACT nasal spray Place 2 sprays into both nostrils daily.     [provider]  lactobacillus acidophilus (BACID) TABS tablet Take 2 tablets by mouth 3 (three) times daily.    [provider]  LORazepam (ATIVAN) 1 MG tablet Take 1 tablet (1  mg total) by mouth every 8 (eight) hours. 07/02/16   Enid Baas, MD  memantine (NAMENDA) 10 MG tablet Take 10 mg by mouth 2 (two) times daily.    [provider]  morphine (ROXANOL) 20 MG/ML concentrated solution Take 0.5 mLs (10 mg total) by mouth every 4 (four) hours as needed for severe pain. 07/02/16   Enid Baas, MD  ondansetron (ZOFRAN) 4 MG tablet Take 4 mg by mouth every 8 (eight) hours as needed for nausea or vomiting.    [provider]  Skin Protectants, Misc. (MINERIN) CREA Apply 1 application topically 2 (two) times daily.    [provider]  traZODone (DESYREL) 50 MG tablet Take 0.5 tablets (25 mg total) by mouth  at bedtime as needed for sleep. 07/02/16   Enid Baas, MD  vitamin B-12 (CYANOCOBALAMIN) 500 MCG tablet Take 500 mcg by mouth daily.    [provider]    Allergies Lisinopril; Statins; and Ciprofloxacin  Family History  Problem Relation Age of Onset  . Hypertension Unknown     Social History Social History  Substance Use Topics  . Smoking status: Never Smoker  . Smokeless tobacco: Never Used  . Alcohol use No    Review of Systems Level V exemption history Limited by the patient's dementia. 10-point ROS otherwise negative.  ____________________________________________   PHYSICAL EXAM:  VITAL SIGNS: ED Triage Vitals  Enc Vitals Group     BP 08/05/16 0127 (!) 151/88     Pulse Rate 08/05/16 0127 (!) 59     Resp 08/05/16 0127 18     Temp 08/05/16 0127 98 F (36.7 C)     Temp Source 08/05/16 0127 Oral     SpO2 08/05/16 0127 98 %     Weight 08/05/16 0128 165 lb (74.8 kg)     Height 08/05/16 0128 5\' 5"  (1.651 m)     Head Circumference --      Peak Flow --      Pain Score --      Pain Loc --      Pain Edu? --      Excl. in GC? --     Constitutional: Chronically ill-appearing profound dementia Eyes: PERRL EOMI. Head: Atraumatic. Nose: No congestion/rhinnorhea. Mouth/Throat: No trismus Neck: No stridor.  No midline tenderness Cardiovascular: Normal rate, regular rhythm. Grossly normal heart sounds.  Good peripheral circulation. Respiratory: Normal respiratory effort.  No retractions. Lungs CTAB and moving good air Gastrointestinal: Soft nontender Musculoskeletal: No lower extremity edema   Neurologic:   No gross focal neurologic deficits are appreciated. Skin:  Skin is warm, dry and intact. No rash noted.     ____________________________________________   DIFFERENTIAL   ____________________________________________   LABS (all labs ordered are listed, but only abnormal results are displayed)  Labs Reviewed  COMPREHENSIVE METABOLIC PANEL  - Abnormal; Notable for the following:       Result Value   Potassium 3.3 (*)    Glucose, Bld 104 (*)    ALT 13 (*)    GFR calc non Af Amer 54 (*)    All other components within normal limits  CBC WITH DIFFERENTIAL/PLATELET - Abnormal; Notable for the following:    RDW 14.9 (*)    Lymphs Abs 0.9 (*)    All other components within normal limits  URINALYSIS, COMPLETE (UACMP) WITH MICROSCOPIC - Abnormal; Notable for the following:    Color, Urine YELLOW (*)    APPearance HAZY (*)    Protein, ur 100 (*)  Nitrite POSITIVE (*)    Bacteria, UA MANY (*)    Squamous Epithelial / LPF 0-5 (*)    All other components within normal limits  URINE CULTURE  TROPONIN I  Signs of urinary tract infection __________________________________________  EKG  ED ECG REPORT I, Merrily BrittleNeil Kevan Prouty, the attending physician, personally viewed and interpreted this ECG.  Date: 08/06/2016 Rate: 61 Rhythm: normal sinus rhythm QRS Axis: normal Intervals: normal ST/T Wave abnormalities: Lateral T-wave inversion chronic Conduction Disturbances: none Narrative Interpretation: Abnormal but unchanged from previous  ____________________________________________  RADIOLOGY  Head and C-spine CT with no acute disease ____________________________________________   PROCEDURES  Procedure(s) performed: no  Procedures  Critical Care performed: no  Observation: no ____________________________________________   INITIAL IMPRESSION / ASSESSMENT AND PLAN / ED COURSE  Pertinent labs & imaging results that were available during my care of the patient were reviewed by me and considered in my medical decision making (see chart for details).  The patient arrives after a fall and is unable to provide any history. While there is no objective signs of trauma 18 CT scans which are fortunately negative for acute pathology. Her in and out urinalysis is consistent with infection so she was given a gram of ceftriaxone here  and will be discharged home with Keflex for 5 days. She is discharged home in improved condition.      ____________________________________________   FINAL CLINICAL IMPRESSION(S) / ED DIAGNOSES  Final diagnoses:  Fall, initial encounter  Urinary tract infection without hematuria, site unspecified      NEW MEDICATIONS STARTED DURING THIS VISIT:  Discharge Medication List as of 08/05/2016  4:04 AM    START taking these medications   Details  cephALEXin (KEFLEX) 500 MG capsule Take 1 capsule (500 mg total) by mouth 3 (three) times daily., Starting Sun 08/05/2016, Until Fri 08/10/2016, Print         Note:  This document was prepared using Dragon voice recognition software and may include unintentional dictation errors.     Merrily Brittleifenbark, Yusif Gnau, MD 08/06/16 [phone]

## 2016-08-05 NOTE — ED Notes (Signed)
In and Out cath performed by this RN and Dewayne HatchAnn, RN. Pt tolerated well and specimen sent to lab.

## 2016-08-05 NOTE — ED Notes (Signed)
Secretary has called EMS to take patient back to [facility]

## 2016-08-06 LAB — URINE CULTURE

## 2016-11-11 ENCOUNTER — Emergency Department

## 2016-11-11 ENCOUNTER — Encounter: Payer: Self-pay | Admitting: Emergency Medicine

## 2016-11-11 ENCOUNTER — Emergency Department
Admission: EM | Admit: 2016-11-11 | Discharge: 2016-11-12 | Disposition: A | Discharge status: Home / Self Care | Attending: Emergency Medicine | Admitting: Emergency Medicine

## 2016-11-11 DIAGNOSIS — J449 Chronic obstructive pulmonary disease, unspecified: Secondary | ICD-10-CM | POA: Diagnosis not present

## 2016-11-11 DIAGNOSIS — Y999 Unspecified external cause status: Secondary | ICD-10-CM | POA: Diagnosis not present

## 2016-11-11 DIAGNOSIS — S79911A Unspecified injury of right hip, initial encounter: Secondary | ICD-10-CM

## 2016-11-11 DIAGNOSIS — I10 Essential (primary) hypertension: Secondary | ICD-10-CM | POA: Diagnosis not present

## 2016-11-11 DIAGNOSIS — M79604 Pain in right leg: Secondary | ICD-10-CM | POA: Diagnosis not present

## 2016-11-11 DIAGNOSIS — Y939 Activity, unspecified: Secondary | ICD-10-CM | POA: Insufficient documentation

## 2016-11-11 DIAGNOSIS — Z7982 Long term (current) use of aspirin: Secondary | ICD-10-CM | POA: Insufficient documentation

## 2016-11-11 DIAGNOSIS — Z79899 Other long term (current) drug therapy: Secondary | ICD-10-CM | POA: Diagnosis not present

## 2016-11-11 DIAGNOSIS — F0390 Unspecified dementia without behavioral disturbance: Secondary | ICD-10-CM | POA: Insufficient documentation

## 2016-11-11 DIAGNOSIS — W19XXXA Unspecified fall, initial encounter: Secondary | ICD-10-CM | POA: Insufficient documentation

## 2016-11-11 DIAGNOSIS — Y92121 Bathroom in nursing home as the place of occurrence of the external cause: Secondary | ICD-10-CM | POA: Insufficient documentation

## 2016-11-11 DIAGNOSIS — S76911A Strain of unspecified muscles, fascia and tendons at thigh level, right thigh, initial encounter: Secondary | ICD-10-CM | POA: Diagnosis not present

## 2016-11-11 MED ORDER — ONDANSETRON HCL 4 MG/2ML IJ SOLN
4.0000 mg | Freq: Once | INTRAMUSCULAR | Status: AC
  Administered 2016-11-11: 4 mg via INTRAVENOUS
  Filled 2016-11-11: qty 2

## 2016-11-11 MED ORDER — MORPHINE SULFATE (PF) 2 MG/ML IV SOLN
2.0000 mg | Freq: Once | INTRAVENOUS | Status: AC
  Administered 2016-11-11: 2 mg via INTRAVENOUS
  Filled 2016-11-11: qty 1

## 2016-11-11 NOTE — ED Notes (Signed)
Blood draw attempted x 2 by this RN. Lab has been called for blood draw.

## 2016-11-11 NOTE — ED Provider Notes (Signed)
[facility] Emergency Department Provider Note ____________________________________________   None    (approximate)  I have reviewed the triage vital signs and the nursing notes.   HISTORY  Chief Complaint Fall  History of present illness severely limited by advanced dementia  HPI Cynthia Glenn is a 83 y.o. female with history of advanced dementia and multiple medical problems whose presents from facility after apparent fall in which she was found down in the bathroom. Fall was unwitnessed. Per daughter patient had reported some right-sided hip or leg pain earlier in the day.  Past Medical History:  Diagnosis Date  . Arthritis   . COPD (chronic obstructive pulmonary disease) (HCC)   . CVA (cerebral infarction)   . Dementia   . Dementia   . Essential tremor   . GERD (gastroesophageal reflux disease)   . HTN (hypertension)   . Hyperlipemia   . Hypertension   . Polycystic kidney disease   . PVD (peripheral vascular disease) (HCC)   . Seasonal allergies   . Shingles   . Vitamin B12 deficiency   . Vitamin D deficiency     Patient Active Problem List   Diagnosis Date Noted  . UTI (urinary tract infection) 06/29/2016  . Bradycardia 06/28/2015  . Cellulitis and abscess of neck 01/03/2015  . Submandibular abscess 01/03/2015    Past Surgical History:  Procedure Laterality Date  . CATARACT EXTRACTION    . COLON SURGERY    . HERNIA REPAIR    . HYSTEROTOMY    . ORIF ANKLE FRACTURE      Prior to Admission medications   Medication Sig Start Date End Date Taking? Authorizing Provider  acetaminophen (TYLENOL) 500 MG tablet Take 500 mg by mouth 2 (two) times daily.    [provider]  albuterol (PROVENTIL) (2.5 MG/3ML) 0.083% nebulizer solution Take 2.5 mg by nebulization every 6 (six) hours as needed for wheezing or shortness of breath.    [provider]  amLODipine (NORVASC) 10 MG tablet Take 10 mg by mouth daily.     [provider]  aspirin 81 MG tablet Take 81 mg by mouth daily.    [provider]  atenolol (TENORMIN) 50 MG tablet Take 50 mg by mouth daily.    [provider]  busPIRone (BUSPAR) 15 MG tablet Take 15 mg by mouth 3 (three) times daily.    [provider]  calcium-vitamin D (OSCAL WITH D) 500-200 MG-UNIT tablet Take 1 tablet by mouth.    [provider]  cefdinir (OMNICEF) 300 MG capsule Take 1 capsule (300 mg total) by mouth 2 (two) times daily. X 5 more days 07/02/16   Enid Baas, MD  Cholecalciferol 2000 units TABS Take 2,000 Units by mouth daily.    [provider]  docusate sodium (COLACE) 100 MG capsule Take 100 mg by mouth 2 (two) times daily as needed for mild constipation.    [provider]  donepezil (ARICEPT) 10 MG tablet Take 10 mg by mouth at bedtime.    [provider]  fluticasone (FLONASE) 50 MCG/ACT nasal spray Place 2 sprays into both nostrils daily.     [provider]  lactobacillus acidophilus (BACID) TABS tablet Take 2 tablets by mouth 3 (three) times daily.    [provider]  LORazepam (ATIVAN) 1 MG tablet Take 1 tablet (1 mg total) by mouth every 8 (eight) hours. 07/02/16   Enid Baas, MD  memantine (NAMENDA) 10 MG tablet Take 10 mg  by mouth 2 (two) times daily.    [provider]  morphine (ROXANOL) 20 MG/ML concentrated solution Take 0.5 mLs (10 mg total) by mouth every 4 (four) hours as needed for severe pain. 07/02/16   Enid Baas, MD  ondansetron (ZOFRAN) 4 MG tablet Take 4 mg by mouth every 8 (eight) hours as needed for nausea or vomiting.    [provider]  Skin Protectants, Misc. (MINERIN) CREA Apply 1 application topically 2 (two) times daily.    [provider]  traZODone (DESYREL) 50 MG tablet Take 0.5 tablets (25 mg total) by mouth at bedtime as needed for sleep. 07/02/16   Enid Baas, MD  vitamin B-12 (CYANOCOBALAMIN)  500 MCG tablet Take 500 mcg by mouth daily.    [provider]    Allergies Lisinopril; Statins; and Ciprofloxacin  Family History  Problem Relation Age of Onset  . Hypertension Unknown     Social History Social History  Substance Use Topics  . Smoking status: Never Smoker  . Smokeless tobacco: Never Used  . Alcohol use No    Review of Systems Level V caveat: Unable to obtain ROS due to dementia     ____________________________________________   PHYSICAL EXAM:  VITAL SIGNS: ED Triage Vitals  Enc Vitals Group     BP 11/11/16 2135 (!) 173/57     Pulse Rate 11/11/16 2135 64     Resp 11/11/16 2135 18     Temp 11/11/16 2135 98.2 F (36.8 C)     Temp Source 11/11/16 2135 Oral     SpO2 11/11/16 2135 99 %     Weight 11/11/16 2126 165 lb (74.8 kg)     Height 11/11/16 2126 5\' 3"  (1.6 m)     Head Circumference --      Peak Flow --      Pain Score --      Pain Loc --      Pain Edu? --      Excl. in GC? --     Constitutional: Alert, disoriented.  No acute distress.  Eyes: Conjunctivae are normal.  Head: Atraumatic. Nose: No congestion/rhinnorhea. Mouth/Throat: Mucous membranes are moist.   Neck: Normal range of motion.  Cardiovascular:Good peripheral circulation. Respiratory: Normal respiratory effort.  No retractions.  Gastrointestinal: Soft and nontender. No distention.  Genitourinary: No CVA tenderness. Musculoskeletal: No lower extremity edema.  Extremities warm and well perfused. Right lower extremity internally rotated. No bony tenderness, no obvious deformity, 2+ DP pulse. Cap refill less than 2 seconds distally. Neurologic:  Normal speech and language. No gross focal neurologic deficits are appreciated.  Skin:  Skin is warm and dry. No rash noted. Psychiatric: Unable to assess.   ____________________________________________   LABS (all labs ordered are listed, but only abnormal results are displayed)  Labs Reviewed  COMPREHENSIVE METABOLIC  PANEL  CBC WITH DIFFERENTIAL/PLATELET  PROTIME-INR  TROPONIN I  TYPE AND SCREEN   ____________________________________________  EKG  ED ECG REPORT I, Dionne Bucy, the attending physician, personally viewed and interpreted this ECG.  Date: 11/11/2016 EKG Time: 2130 Rate: 67 Rhythm: normal sinus rhythm QRS Axis: left axis Intervals: short PR ST/T Wave abnormalities: t-wave inversions V4 and V5 Narrative Interpretation: nonspecific findings  ____________________________________________  RADIOLOGY    ____________________________________________   PROCEDURES  Procedure(s) performed: No    Critical Care performed: No ____________________________________________   INITIAL IMPRESSION / ASSESSMENT AND PLAN / ED COURSE  Pertinent labs & imaging results that were available during my care of the  patient were reviewed by me and considered in my medical decision making (see chart for details).  83 year old female with advanced dementia presents after apparent fall in her facility. Unclear if syncope or mechanical fall. Per daughter patient currently is at baseline mental status. The right lower extremity is internally rotated however there is no obvious deformity. There is no e/o other injuries on exam.  Vital signs are stable. Plan for x-rays the right lower extremity as well as admission labs since patient is highly likely to have fracture.      ----------------------------------------- 11:53 PM on 11/11/2016 ----------------------------------------- X-ray shows internal rotation but no obvious fracture. We will obtain CT to further evaluate. Also obtain CT head. Patient signed out to Dr. Zenda Alpers.  ____________________________________________   FINAL CLINICAL IMPRESSION(S) / ED DIAGNOSES  Final diagnoses:  Injury of right hip, initial encounter      NEW MEDICATIONS STARTED DURING THIS VISIT:  New Prescriptions   No medications on file     Note:  This  document was prepared using Dragon voice recognition software and may include unintentional dictation errors.    Dionne Bucy, MD 11/12/16 0010

## 2016-11-11 NOTE — ED Notes (Signed)
Pt to xray via stretcher with Eddie from radiology

## 2016-11-11 NOTE — ED Triage Notes (Signed)
Pt fell coming out of the BR at [employer] Pt c/o right hip pain at this time. Pt has hx of dementia and VS WDL per [facility].

## 2016-11-12 LAB — COMPREHENSIVE METABOLIC PANEL
ALK PHOS: 62 U/L (ref 38–126)
ALT: 21 U/L (ref 14–54)
AST: 32 U/L (ref 15–41)
Albumin: 3.8 g/dL (ref 3.5–5.0)
Anion gap: 8 (ref 5–15)
BILIRUBIN TOTAL: 0.9 mg/dL (ref 0.3–1.2)
BUN: 28 mg/dL — AB (ref 6–20)
CALCIUM: 9 mg/dL (ref 8.9–10.3)
CHLORIDE: 111 mmol/L (ref 101–111)
CO2: 27 mmol/L (ref 22–32)
CREATININE: 0.97 mg/dL (ref 0.44–1.00)
GFR calc Af Amer: 56 mL/min — ABNORMAL LOW (ref >60)
GFR, EST NON AFRICAN AMERICAN: 48 mL/min — AB (ref >60)
Glucose, Bld: 121 mg/dL — ABNORMAL HIGH (ref 65–99)
Potassium: 4.1 mmol/L (ref 3.5–5.1)
Sodium: 146 mmol/L — ABNORMAL HIGH (ref 135–145)
Total Protein: 6.7 g/dL (ref 6.5–8.1)

## 2016-11-12 LAB — CBC WITH DIFFERENTIAL/PLATELET
BASOS PCT: 0 %
Basophils Absolute: 0 10*3/uL (ref 0–0.1)
EOS ABS: 0 10*3/uL (ref 0–0.7)
EOS PCT: 0 %
HCT: 37.3 % (ref 35.0–47.0)
Hemoglobin: 12.9 g/dL (ref 12.0–16.0)
LYMPHS ABS: 0.6 10*3/uL — AB (ref 1.0–3.6)
Lymphocytes Relative: 6 %
MCH: 31.2 pg (ref 26.0–34.0)
MCHC: 34.7 g/dL (ref 32.0–36.0)
MCV: 89.9 fL (ref 80.0–100.0)
Monocytes Absolute: 1.2 10*3/uL — ABNORMAL HIGH (ref 0.2–0.9)
Monocytes Relative: 11 %
Neutro Abs: 9.2 10*3/uL — ABNORMAL HIGH (ref 1.4–6.5)
Neutrophils Relative %: 83 %
PLATELETS: 218 10*3/uL (ref 150–440)
RBC: 4.15 MIL/uL (ref 3.80–5.20)
RDW: 13.6 % (ref 11.5–14.5)
WBC: 11.1 10*3/uL — AB (ref 3.6–11.0)

## 2016-11-12 LAB — TYPE AND SCREEN
ABO/RH(D): A POS
Antibody Screen: NEGATIVE

## 2016-11-12 LAB — PROTIME-INR
INR: 1.03
PROTHROMBIN TIME: 13.5 s (ref 11.4–15.2)

## 2016-11-12 LAB — TROPONIN I: Troponin I: <0.03 ng/mL (ref <0.03)

## 2016-11-12 NOTE — ED Provider Notes (Signed)
-----------------------------------------   1:18 AM on 11/12/2016 -----------------------------------------   Blood pressure (!) 168/58, pulse 74, temperature 98.2 F (36.8 C), temperature source Oral, resp. rate (!) 24, height 5\' 3"  (1.6 m), weight 74.8 kg (165 lb), SpO2 97 %.  Assuming care from Dr. Marisa Severin.  In short, Cynthia Glenn is a 83 y.o. female with a chief complaint of Fall .  Refer to the original H&P for additional details.  The current plan of care is to follow up the results of the blood work and the CT scan.   Clinical Course as of Nov 12 116  Mon Nov 12, 2016  0116 1. Mild enlargement of the right iliopsoas with adjacent fat stranding suspicious for intramuscular strain. 2. No acute displaced fracture of the right hip.   CT HIP RIGHT WO CONTRAST [AW]  0117 1. No acute intracranial process. 2. Stable examination including mild chronic small vessel ischemic disease and old lacunar infarcts.   CT Head Wo Contrast [AW]  0117 The patient is sleeping in no acute distress. The patient's CT scan is unremarkable. The patient's blood work is unremarkable. She will be discharged back to her nursing home.  [AW]    Clinical Course User Index [AW] Rebecka Apley, MD      Rebecka Apley, MD 11/12/16 [phone]

## 2016-11-12 NOTE — ED Notes (Signed)
Pt given bedpan and urinated. Pt's brief changed at this time.

## 2016-11-12 NOTE — Discharge Instructions (Signed)
Please follow-up with the acute care clinic or with your primary care physician for further evaluation.

## 2017-07-31 ENCOUNTER — Emergency Department

## 2017-07-31 ENCOUNTER — Emergency Department
Admission: EM | Admit: 2017-07-31 | Discharge: 2017-07-31 | Disposition: A | Discharge status: Skilled Nursing Facility | Attending: Emergency Medicine | Admitting: Emergency Medicine

## 2017-07-31 ENCOUNTER — Other Ambulatory Visit: Payer: Self-pay

## 2017-07-31 DIAGNOSIS — F0280 Dementia in other diseases classified elsewhere without behavioral disturbance: Secondary | ICD-10-CM | POA: Insufficient documentation

## 2017-07-31 DIAGNOSIS — G309 Alzheimer's disease, unspecified: Secondary | ICD-10-CM | POA: Diagnosis not present

## 2017-07-31 DIAGNOSIS — W19XXXA Unspecified fall, initial encounter: Secondary | ICD-10-CM

## 2017-07-31 DIAGNOSIS — J449 Chronic obstructive pulmonary disease, unspecified: Secondary | ICD-10-CM | POA: Insufficient documentation

## 2017-07-31 DIAGNOSIS — R4182 Altered mental status, unspecified: Secondary | ICD-10-CM | POA: Diagnosis present

## 2017-07-31 DIAGNOSIS — I10 Essential (primary) hypertension: Secondary | ICD-10-CM | POA: Insufficient documentation

## 2017-07-31 LAB — CBC WITH DIFFERENTIAL/PLATELET
BASOS PCT: 0 %
Basophils Absolute: 0 10*3/uL (ref 0–0.1)
EOS ABS: 0.1 10*3/uL (ref 0–0.7)
EOS PCT: 2 %
HCT: 39.4 % (ref 35.0–47.0)
HEMOGLOBIN: 13.7 g/dL (ref 12.0–16.0)
LYMPHS PCT: 11 %
Lymphs Abs: 0.6 10*3/uL — ABNORMAL LOW (ref 1.0–3.6)
MCH: 31.4 pg (ref 26.0–34.0)
MCHC: 34.8 g/dL (ref 32.0–36.0)
MCV: 90.2 fL (ref 80.0–100.0)
MONOS PCT: 8 %
Monocytes Absolute: 0.4 10*3/uL (ref 0.2–0.9)
NEUTROS ABS: 4 10*3/uL (ref 1.4–6.5)
Neutrophils Relative %: 79 %
PLATELETS: 213 10*3/uL (ref 150–440)
RBC: 4.37 MIL/uL (ref 3.80–5.20)
RDW: 14.7 % — AB (ref 11.5–14.5)
WBC: 5.1 10*3/uL (ref 3.6–11.0)

## 2017-07-31 LAB — COMPREHENSIVE METABOLIC PANEL
ALT: 11 U/L — ABNORMAL LOW (ref 14–54)
ANION GAP: 6 (ref 5–15)
AST: 17 U/L (ref 15–41)
Albumin: 3.5 g/dL (ref 3.5–5.0)
Alkaline Phosphatase: 72 U/L (ref 38–126)
BUN: 23 mg/dL — ABNORMAL HIGH (ref 6–20)
CALCIUM: 8.5 mg/dL — AB (ref 8.9–10.3)
CHLORIDE: 109 mmol/L (ref 101–111)
CO2: 25 mmol/L (ref 22–32)
Creatinine, Ser: 0.95 mg/dL (ref 0.44–1.00)
GFR calc non Af Amer: 49 mL/min — ABNORMAL LOW (ref >60)
GFR, EST AFRICAN AMERICAN: 57 mL/min — AB (ref >60)
Glucose, Bld: 112 mg/dL — ABNORMAL HIGH (ref 65–99)
POTASSIUM: 3.6 mmol/L (ref 3.5–5.1)
SODIUM: 140 mmol/L (ref 135–145)
Total Bilirubin: 0.6 mg/dL (ref 0.3–1.2)
Total Protein: 6.1 g/dL — ABNORMAL LOW (ref 6.5–8.1)

## 2017-07-31 LAB — TROPONIN I: Troponin I: <0.03 ng/mL (ref <0.03)

## 2017-07-31 NOTE — ED Provider Notes (Signed)
[facility] Emergency Department Provider Note       Time seen: ----------------------------------------- 12:09 PM on 07/31/2017 ----------------------------------------- Level V caveat: History/ROS limited by altered mental status  I have reviewed the triage vital signs and the nursing notes.  HISTORY   Chief Complaint No chief complaint on file.    HPI Cynthia Glenn is a 84 y.o. female with a history of arthritis, COPD, dementia, GERD, hypertension, hyperlipidemia, peripheral vascular disease who presents to the ED for unwitnessed fall at the nursing home.  Caregivers report they heard a fall and went in to find her on the floor.  She has no complaints at this time but she does have advanced dementia.  Past Medical History:  Diagnosis Date  . Arthritis   . COPD (chronic obstructive pulmonary disease) (HCC)   . CVA (cerebral infarction)   . Dementia   . Dementia   . Essential tremor   . GERD (gastroesophageal reflux disease)   . HTN (hypertension)   . Hyperlipemia   . Hypertension   . Polycystic kidney disease   . PVD (peripheral vascular disease) (HCC)   . Seasonal allergies   . Shingles   . Vitamin B12 deficiency   . Vitamin D deficiency     Patient Active Problem List   Diagnosis Date Noted  . UTI (urinary tract infection) 06/29/2016  . Bradycardia 06/28/2015  . Cellulitis and abscess of neck 01/03/2015  . Submandibular abscess 01/03/2015    Past Surgical History:  Procedure Laterality Date  . CATARACT EXTRACTION    . COLON SURGERY    . HERNIA REPAIR    . HYSTEROTOMY    . ORIF ANKLE FRACTURE      Allergies Lisinopril; Statins; and Ciprofloxacin  Social History Social History   Tobacco Use  . Smoking status: Never Smoker  . Smokeless tobacco: Never Used  Substance Use Topics  . Alcohol use: No  . Drug use: No   Review of Systems Patient denies any complaints, just states she is hungry  All systems otherwise  unknown at this time.  ____________________________________________   PHYSICAL EXAM:  VITAL SIGNS: ED Triage Vitals  Enc Vitals Group     BP      Pulse      Resp      Temp      Temp src      SpO2      Weight      Height      Head Circumference      Peak Flow      Pain Score      Pain Loc      Pain Edu?      Excl. in GC?    Constitutional: Alert but disoriented.  Well appearing and in no distress. Eyes: Conjunctivae are normal. Normal extraocular movements. ENT   Head: Normocephalic and atraumatic.   Nose: No congestion/rhinnorhea.   Mouth/Throat: Mucous membranes are dry   Neck: No stridor. Cardiovascular: Normal rate, regular rhythm. No murmurs, rubs, or gallops. Respiratory: Normal respiratory effort without tachypnea nor retractions. Breath sounds are clear and equal bilaterally. No wheezes/rales/rhonchi. Gastrointestinal: Soft and nontender. Normal bowel sounds Musculoskeletal: Nontender with normal range of motion in extremities. No lower extremity tenderness nor edema. Neurologic:  Normal speech and language. No gross focal neurologic deficits are appreciated.  Skin:  Skin is warm, dry and intact. No rash noted. Psychiatric: Mood and affect are normal.  ____________________________________________  EKG: Interpreted by me.  Sinus bradycardia with  first-degree AV block, rate is 57 bpm, prolonged PR interval, left axis deviation, normal QT  ____________________________________________  ED COURSE:  As part of my medical decision making, I reviewed the following data within the electronic MEDICAL RECORD NUMBER History obtained from family if available, nursing notes, old chart and ekg, as well as notes from prior ED visits. Patient presented for altered mental status with fall, possible syncope, we will assess with labs and imaging as indicated at this time.   Procedures ____________________________________________   LABS (pertinent  positives/negatives)  Labs Reviewed  CBC WITH DIFFERENTIAL/PLATELET - Abnormal; Notable for the following components:      Result Value   RDW 14.7 (*)    Lymphs Abs 0.6 (*)    All other components within normal limits  COMPREHENSIVE METABOLIC PANEL - Abnormal; Notable for the following components:   Glucose, Bld 112 (*)    BUN 23 (*)    Calcium 8.5 (*)    Total Protein 6.1 (*)    ALT 11 (*)    GFR calc non Af Amer 49 (*)    GFR calc Af Amer 57 (*)    All other components within normal limits  TROPONIN I  URINALYSIS, COMPLETE (UACMP) WITH MICROSCOPIC    RADIOLOGY Images were viewed by me  CT head, pelvis x-rays IMPRESSION: No skull fracture or intracranial hemorrhage. Chronic microvascular changes. Atrophy. IMPRESSION: No acute abnormality in the pelvis.  ____________________________________________  DIFFERENTIAL DIAGNOSIS   Fall, head injury, fracture, dehydration, electrolyte abnormality, occult infection  FINAL ASSESSMENT AND PLAN  Unwitnessed fall  Plan: The patient had presented for an unwitnessed fall. Patient's labs are unremarkable. Patient's imaging is unremarkable as well, patient is cleared for outpatient follow-up.  She is under the care of hospice.   Ulice Dash, MD   Note: This note was generated in part or whole with voice recognition software. Voice recognition is usually quite accurate but there are transcription errors that can and very often do occur. I apologize for any typographical errors that were not detected and corrected.     Emily Filbert, MD 07/31/17 1345

## 2017-07-31 NOTE — Progress Notes (Signed)
ED visit made. Patient is currently followed by [facility] at [facility] with a Hospice diagnosis Alzheimer's disease. She is a DNR code. She was sent via EMS to the [facility] ED today by facility staff for evaluation following an unwitnessed fall from her wheel chair. CT head showed no cute process/injury pelvic xray was also negative for any acute injury. Labs unremarkable for infectious process. Writer spoke with EDP Dr. Mayford Knife to advise that patient was currently receiving hospice services. Plan is for discharge back to the [facility], patient will transport via EMS.  Patient seen lying on the ED stretcher, eyes closed, did not interact with writer, but did state shew as cold, warm blanket applied. Per Dr. Mayford Knife patient has not complained of any pain, she did report being hungry and was given and ate a truly sandwich. EMS has been notified for transport. Hospice team alerted to discharge. Notes faxed to triage/medical records. Dayna Barker RN, BSN, [facility] [place], hospital Liaison [phone]

## 2017-07-31 NOTE — ED Triage Notes (Addendum)
Pt comes into the ED via EMS from the [place] [facility] [place] with c/o pt having an unwitnessed fall that was heard by staff states they found her on the floor in the BR. Pt is normally in a wheelchair and scoots herself around./ pt has a hx of alzheimer's .. [id number]

## 2017-07-31 NOTE — ED Notes (Signed)
Called [facility] for transport back to the [place] of [address]

## 2017-07-31 NOTE — ED Notes (Signed)
Awake and alert. NAD 

## 2017-09-23 DEATH — deceased

## 2018-05-18 IMAGING — CT CT HEAD W/O CM
5 of 6 series · 17 of 47 positions shown, 18 images · non-contrast
Comparison: 07/07/2015

CLINICAL DATA: Patient slipped from wheelchair hitting ground.
Dementia and disorientation. Unwitnessed fall.

EXAM:
CT HEAD WITHOUT CONTRAST
CT CERVICAL SPINE WITHOUT CONTRAST
TECHNIQUE: Multidetector CT imaging of the head and cervical spine was
performed following the standard protocol without intravenous
contrast. Multiplanar CT image reconstructions of the cervical spine
were also generated.

[Series 2: head wo · axial · 0.41mm/px · z∈[+686,+736]mm · 2 of 32 slices shown, 3 images]
[im 11/32  brain]
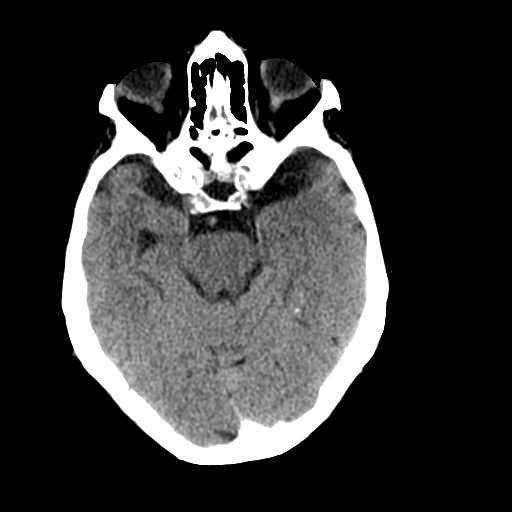
[im 11/32  bone]
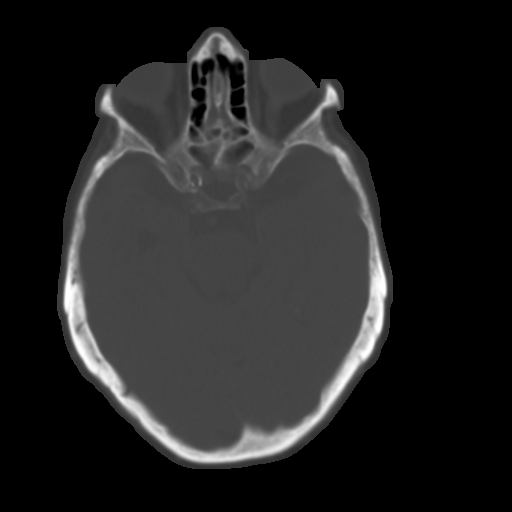
[im 21/32  brain]
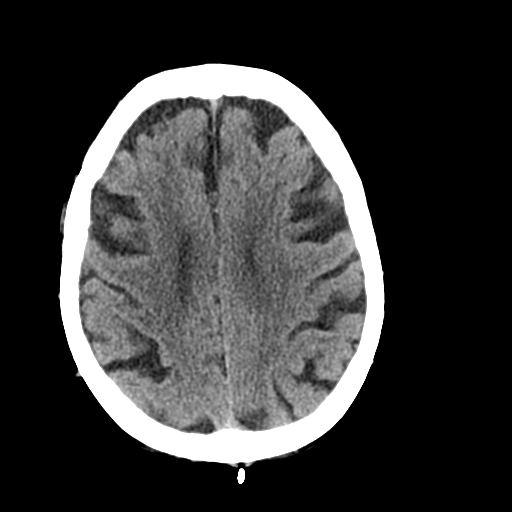

[Series 4: coronal soft tissue · coronal · 0.30mm/px · 3 of 65 slices shown]
[im 22/65  brain]
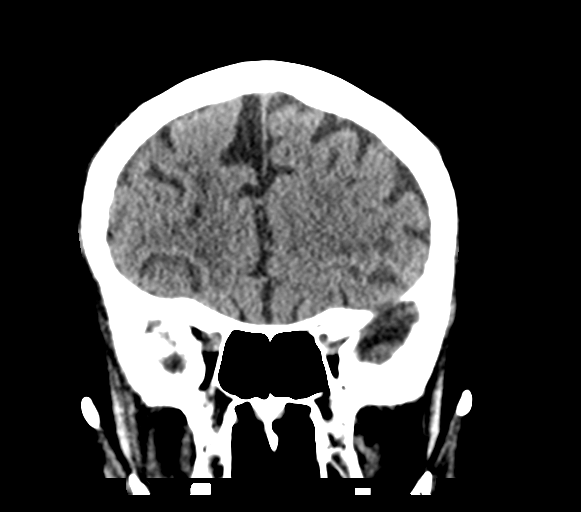
[im 29/65  brain]
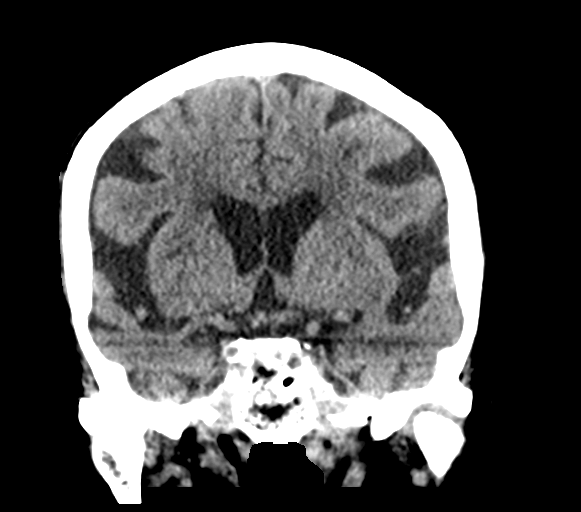
[im 36/65  brain]
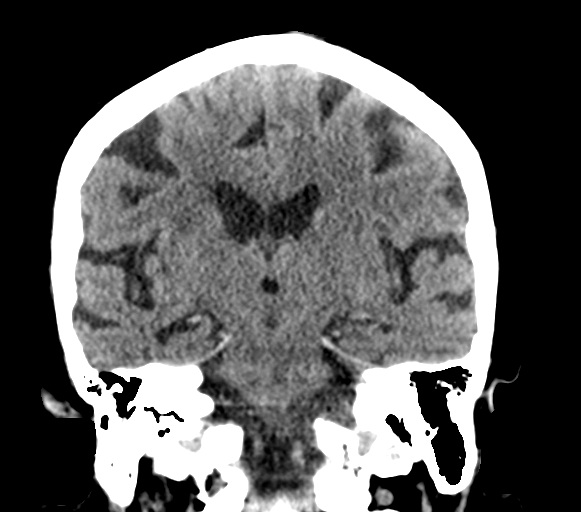

[Series 5: sagittal soft tissue · sagittal · 0.30mm/px · 1 of 48 slices shown]
[im 24/48  brain]
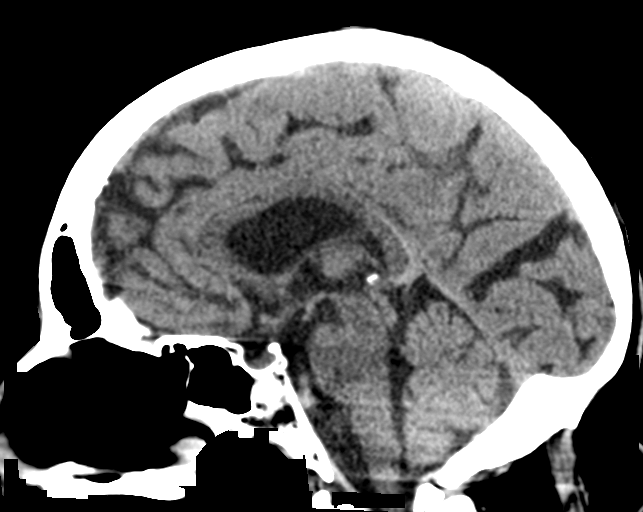

[Series 7: c spine soft · axial · 0.30mm/px · z∈[+531,+573]mm · 3 of 76 slices shown]
[im 7/76  brain]
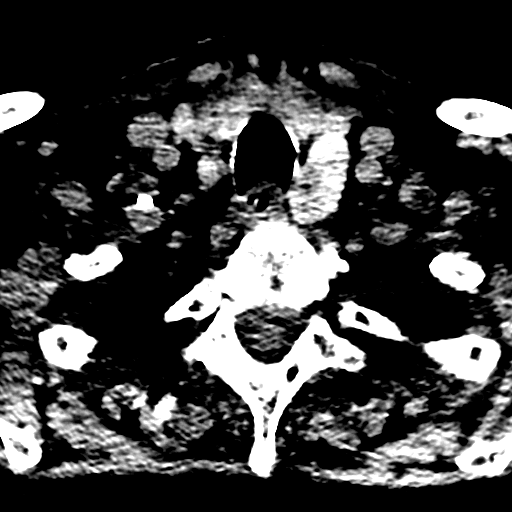
[im 14/76  brain]
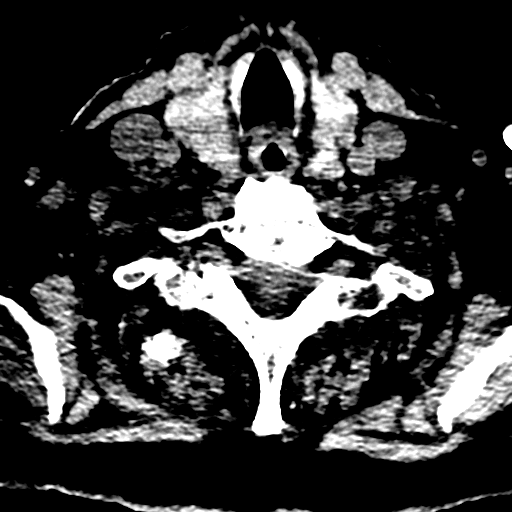
[im 28/76  brain]
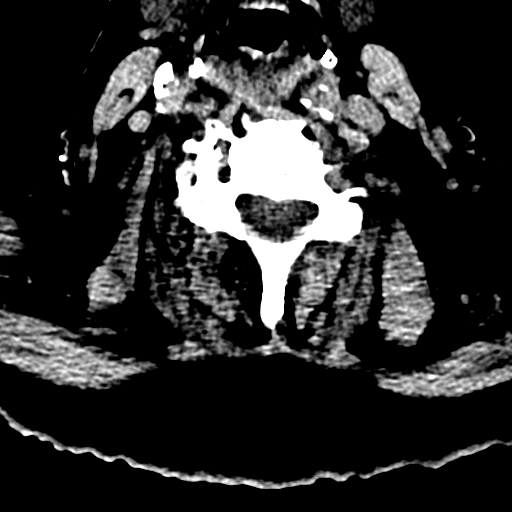

[Series 10: orthogonal bone · axial · 0.23mm/px · z∈[+495,+616]mm · 8 of 89 slices shown]
[im 7/89  bone]
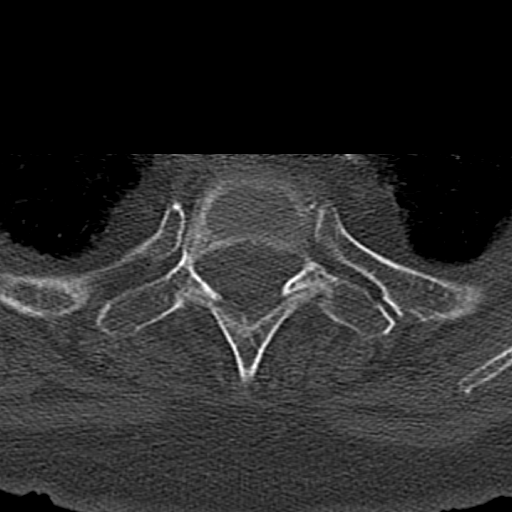
[im 21/89  bone]
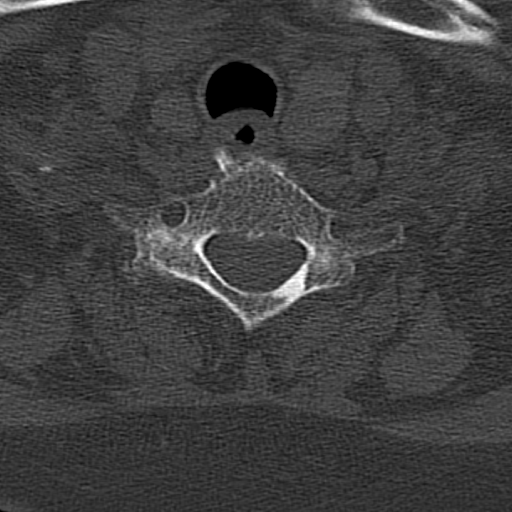
[im 28/89  bone]
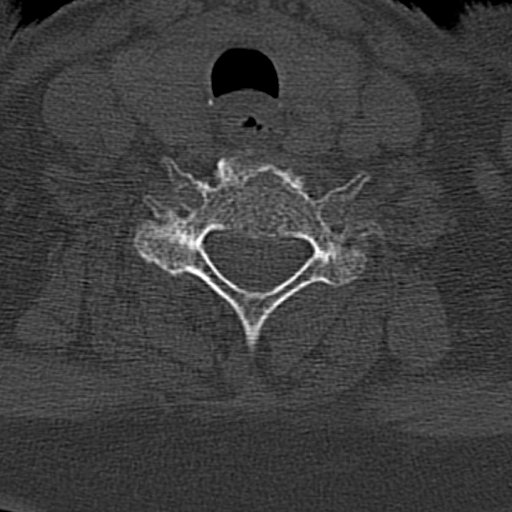
[im 41/89  bone]
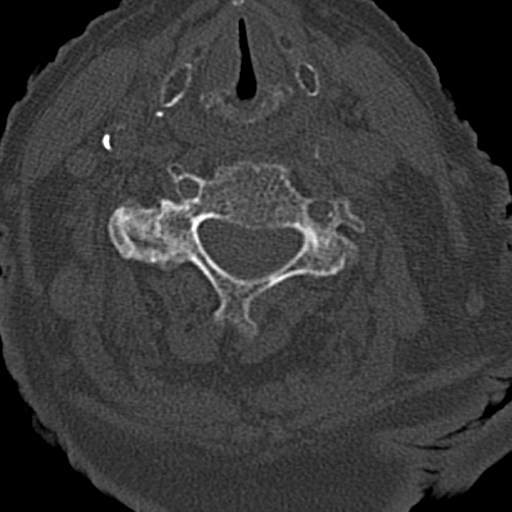
[im 48/89  bone]
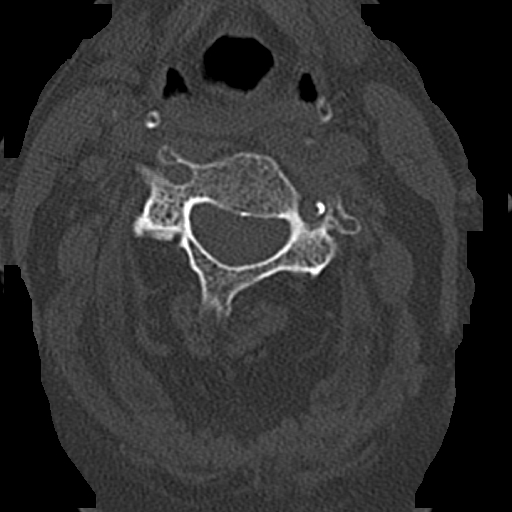
[im 61/89  bone]
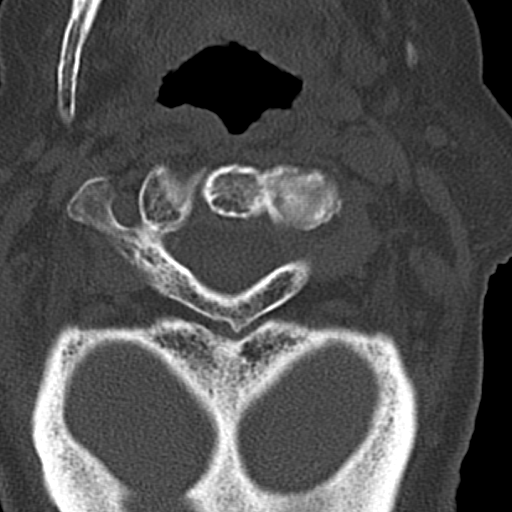
[im 68/89  bone]
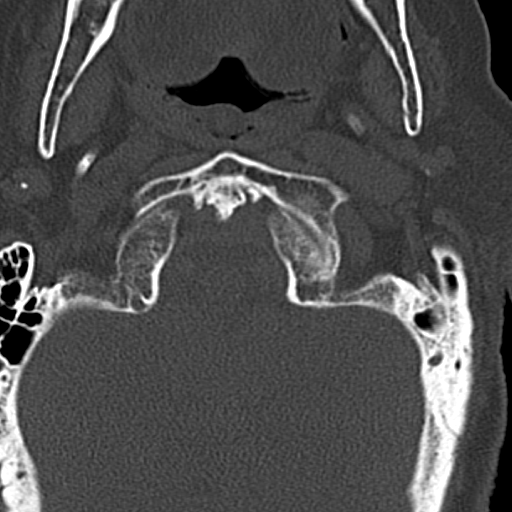
[im 82/89  bone]
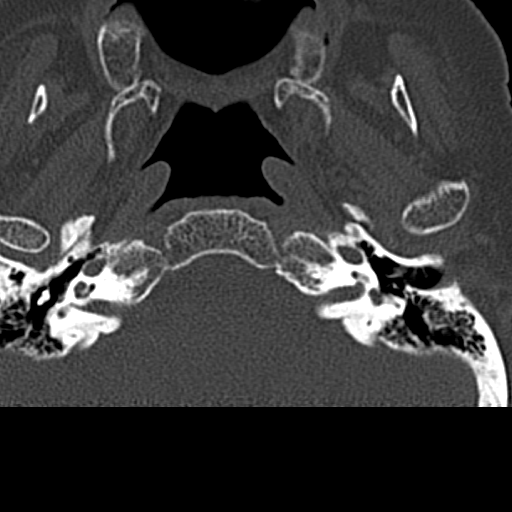

[17 of 47 positions shown; findings below may reference images not displayed]

FINDINGS: CT HEAD FINDINGS

BRAIN: There is sulcal and ventricular prominence consistent with
superficial and central atrophy. Chronic bilateral basal ganglial
hypodensities consistent with lacunar infarcts. No intraparenchymal
hemorrhage, mass effect nor midline shift. Periventricular and
subcortical white matter hypodensities consistent with chronic small
vessel ischemic disease are identified. No acute large vascular
territory infarcts. No abnormal extra-axial fluid collections. Basal
cisterns are not effaced and midline.

VASCULAR: Moderate calcific atherosclerosis of the carotid siphons.

SKULL: No skull fracture. No significant scalp soft tissue swelling.

SINUSES/ORBITS: The mastoid air-cells are clear. The included
paranasal sinuses are well-aerated.The included ocular globes and
orbital contents are non-suspicious.

OTHER: None.

CT CERVICAL SPINE FINDINGS

Alignment: Maintained craniocervical relationship. Osteoarthritis of
the atlantodental interval with joint space narrowing and sclerosis.
Slight reversal cervical lordosis with apex at C5. Minimal
anterolisthesis of C3 on C4 by 1-2 mm and of C4 on C5 as before.

Skull base and vertebrae: No fracture

Soft tissues and spinal canal: No prevertebral soft tissue swelling.
No intraspinal hematoma.

Disc levels: Disc space narrowing from C4 through C7 with small
posterior marginal osteophytes and associated uncovertebral joint
spurring. Chronic bilateral ankylosis of the facets at C4-5. Minimal
neural foraminal encroachment from osteophytes on the left at C5-6
on the right at C3-4.

Upper chest: Negative.

Other: None
IMPRESSION: 1. Chronic small vessel ischemic disease of periventricular white
matter. Chronic bilateral small basal ganglial lacune or infarcts.
Chronic stable cerebral atrophy.
2. No acute intracranial abnormality.
3. No acute posttraumatic cervical spine fracture or subluxation.
